# Patient Record
Sex: Male | Born: 1965 | Race: White | Hispanic: No | Marital: Single | Smoking: Former smoker
Health system: Southern US, Community
[De-identification: ages and names within clinical notes are randomized; demographics above are authoritative.]

## PROBLEM LIST (undated history)

## (undated) DIAGNOSIS — D329 Benign neoplasm of meninges, unspecified: Secondary | ICD-10-CM

## (undated) DIAGNOSIS — I10 Essential (primary) hypertension: Secondary | ICD-10-CM

## (undated) DIAGNOSIS — Z720 Tobacco use: Secondary | ICD-10-CM

## (undated) DIAGNOSIS — M1990 Unspecified osteoarthritis, unspecified site: Secondary | ICD-10-CM

## (undated) DIAGNOSIS — F1010 Alcohol abuse, uncomplicated: Secondary | ICD-10-CM

## (undated) DIAGNOSIS — R519 Headache, unspecified: Secondary | ICD-10-CM

## (undated) DIAGNOSIS — Z599 Problem related to housing and economic circumstances, unspecified: Secondary | ICD-10-CM

## (undated) DIAGNOSIS — Z598 Other problems related to housing and economic circumstances: Secondary | ICD-10-CM

## (undated) DIAGNOSIS — J449 Chronic obstructive pulmonary disease, unspecified: Secondary | ICD-10-CM

## (undated) DIAGNOSIS — I639 Cerebral infarction, unspecified: Secondary | ICD-10-CM

## (undated) HISTORY — DX: Cerebral infarction, unspecified: I63.9

## (undated) HISTORY — DX: Chronic obstructive pulmonary disease, unspecified: J44.9

## (undated) HISTORY — DX: Benign neoplasm of meninges, unspecified: D32.9

## (undated) HISTORY — PX: SHOULDER ARTHROSCOPY: SHX128

## (undated) HISTORY — DX: Headache, unspecified: R51.9

---

## 2002-02-04 ENCOUNTER — Emergency Department (HOSPITAL_COMMUNITY): Admission: EM | Admit: 2002-02-04 | Discharge: 2002-02-04 | Payer: Self-pay | Admitting: Emergency Medicine

## 2004-03-22 ENCOUNTER — Emergency Department (HOSPITAL_COMMUNITY): Admission: EM | Admit: 2004-03-22 | Discharge: 2004-03-22 | Payer: Self-pay | Admitting: Emergency Medicine

## 2004-07-27 ENCOUNTER — Emergency Department (HOSPITAL_COMMUNITY): Admission: EM | Admit: 2004-07-27 | Discharge: 2004-07-27 | Payer: Self-pay | Admitting: Emergency Medicine

## 2004-08-05 ENCOUNTER — Ambulatory Visit: Payer: Self-pay | Admitting: Family Medicine

## 2005-02-22 ENCOUNTER — Ambulatory Visit: Payer: Self-pay | Admitting: Family Medicine

## 2005-07-20 ENCOUNTER — Ambulatory Visit: Payer: Self-pay | Admitting: Family Medicine

## 2007-05-02 ENCOUNTER — Emergency Department (HOSPITAL_COMMUNITY): Admission: EM | Admit: 2007-05-02 | Discharge: 2007-05-02 | Payer: Self-pay | Admitting: Emergency Medicine

## 2008-05-05 ENCOUNTER — Emergency Department (HOSPITAL_COMMUNITY): Admission: EM | Admit: 2008-05-05 | Discharge: 2008-05-05 | Payer: Self-pay | Admitting: Emergency Medicine

## 2009-09-04 ENCOUNTER — Emergency Department (HOSPITAL_COMMUNITY): Admission: EM | Admit: 2009-09-04 | Discharge: 2009-09-04 | Payer: Self-pay | Admitting: Emergency Medicine

## 2011-04-26 LAB — DIFFERENTIAL
Basophils Absolute: 0.1
Basophils Relative: 1
Eosinophils Absolute: 0.1
Monocytes Absolute: 0.4
Monocytes Relative: 6
Neutrophils Relative %: 61

## 2011-04-26 LAB — SAMPLE TO BLOOD BANK

## 2011-04-26 LAB — I-STAT 8, (EC8 V) (CONVERTED LAB)
Acid-Base Excess: 1
Chloride: 103
Hemoglobin: 16.3
Potassium: 3.9

## 2011-04-26 LAB — POCT CARDIAC MARKERS
CKMB, poc: 1.2
Myoglobin, poc: 60.7
Operator id: 146091
Operator id: 285841
Troponin i, poc: <0.05

## 2011-04-26 LAB — POCT I-STAT CREATININE: Creatinine, Ser: 1

## 2011-04-26 LAB — CBC
HCT: 44.8
Platelets: 314
RBC: 4.87

## 2014-08-17 ENCOUNTER — Encounter (HOSPITAL_COMMUNITY): Payer: Self-pay | Admitting: Emergency Medicine

## 2014-08-17 ENCOUNTER — Emergency Department (HOSPITAL_COMMUNITY): Payer: Self-pay

## 2014-08-17 ENCOUNTER — Inpatient Hospital Stay (HOSPITAL_COMMUNITY)
Admission: EM | Admit: 2014-08-17 | Discharge: 2014-08-19 | DRG: 282 | Disposition: A | Discharge status: Home / Self Care | Payer: Self-pay | Attending: Interventional Cardiology | Admitting: Interventional Cardiology

## 2014-08-17 ENCOUNTER — Inpatient Hospital Stay (HOSPITAL_COMMUNITY): Payer: Self-pay

## 2014-08-17 DIAGNOSIS — F1010 Alcohol abuse, uncomplicated: Secondary | ICD-10-CM | POA: Diagnosis present

## 2014-08-17 DIAGNOSIS — I10 Essential (primary) hypertension: Secondary | ICD-10-CM | POA: Diagnosis present

## 2014-08-17 DIAGNOSIS — F17210 Nicotine dependence, cigarettes, uncomplicated: Secondary | ICD-10-CM | POA: Diagnosis present

## 2014-08-17 DIAGNOSIS — R0989 Other specified symptoms and signs involving the circulatory and respiratory systems: Secondary | ICD-10-CM

## 2014-08-17 DIAGNOSIS — Z599 Problem related to housing and economic circumstances, unspecified: Secondary | ICD-10-CM | POA: Insufficient documentation

## 2014-08-17 DIAGNOSIS — I214 Non-ST elevation (NSTEMI) myocardial infarction: Principal | ICD-10-CM | POA: Diagnosis present

## 2014-08-17 DIAGNOSIS — I200 Unstable angina: Secondary | ICD-10-CM | POA: Diagnosis present

## 2014-08-17 DIAGNOSIS — R001 Bradycardia, unspecified: Secondary | ICD-10-CM | POA: Diagnosis present

## 2014-08-17 DIAGNOSIS — I25111 Atherosclerotic heart disease of native coronary artery with angina pectoris with documented spasm: Secondary | ICD-10-CM | POA: Diagnosis present

## 2014-08-17 DIAGNOSIS — M1990 Unspecified osteoarthritis, unspecified site: Secondary | ICD-10-CM | POA: Insufficient documentation

## 2014-08-17 DIAGNOSIS — Z8249 Family history of ischemic heart disease and other diseases of the circulatory system: Secondary | ICD-10-CM

## 2014-08-17 DIAGNOSIS — R739 Hyperglycemia, unspecified: Secondary | ICD-10-CM | POA: Diagnosis present

## 2014-08-17 DIAGNOSIS — Z598 Other problems related to housing and economic circumstances: Secondary | ICD-10-CM | POA: Insufficient documentation

## 2014-08-17 DIAGNOSIS — Z720 Tobacco use: Secondary | ICD-10-CM

## 2014-08-17 DIAGNOSIS — R0789 Other chest pain: Secondary | ICD-10-CM

## 2014-08-17 HISTORY — DX: Unspecified osteoarthritis, unspecified site: M19.90

## 2014-08-17 HISTORY — DX: Problem related to housing and economic circumstances, unspecified: Z59.9

## 2014-08-17 HISTORY — DX: Other problems related to housing and economic circumstances: Z59.8

## 2014-08-17 HISTORY — DX: Alcohol abuse, uncomplicated: F10.10

## 2014-08-17 HISTORY — DX: Tobacco use: Z72.0

## 2014-08-17 HISTORY — DX: Essential (primary) hypertension: I10

## 2014-08-17 LAB — CBC
HCT: 49.2 % (ref 39.0–52.0)
HEMOGLOBIN: 17.2 g/dL — AB (ref 13.0–17.0)
MCH: 33.5 pg (ref 26.0–34.0)
MCHC: 35 g/dL (ref 30.0–36.0)
MCV: 95.9 fL (ref 78.0–100.0)
Platelets: 277 10*3/uL (ref 150–400)
RBC: 5.13 MIL/uL (ref 4.22–5.81)
RDW: 13.2 % (ref 11.5–15.5)
WBC: 9.7 10*3/uL (ref 4.0–10.5)

## 2014-08-17 LAB — TSH: TSH: 4.743 u[IU]/mL — ABNORMAL HIGH (ref 0.350–4.500)

## 2014-08-17 LAB — HEPATIC FUNCTION PANEL
ALT: 23 U/L (ref 0–53)
AST: 32 U/L (ref 0–37)
Albumin: 4 g/dL (ref 3.5–5.2)
Alkaline Phosphatase: 95 U/L (ref 39–117)
BILIRUBIN DIRECT: 0.2 mg/dL (ref 0.0–0.5)
BILIRUBIN TOTAL: 0.8 mg/dL (ref 0.3–1.2)
Indirect Bilirubin: 0.6 mg/dL (ref 0.3–0.9)
Total Protein: 6.9 g/dL (ref 6.0–8.3)

## 2014-08-17 LAB — BASIC METABOLIC PANEL
Anion gap: 10 (ref 5–15)
BUN: 11 mg/dL (ref 6–23)
CO2: 24 mmol/L (ref 19–32)
Calcium: 8.8 mg/dL (ref 8.4–10.5)
Chloride: 105 mmol/L (ref 96–112)
Creatinine, Ser: 1.09 mg/dL (ref 0.50–1.35)
GFR, EST AFRICAN AMERICAN: 90 mL/min — AB (ref >90)
GFR, EST NON AFRICAN AMERICAN: 77 mL/min — AB (ref >90)
Glucose, Bld: 177 mg/dL — ABNORMAL HIGH (ref 70–99)
POTASSIUM: 3.8 mmol/L (ref 3.5–5.1)
Sodium: 139 mmol/L (ref 135–145)

## 2014-08-17 LAB — PROTIME-INR
INR: 1.01 (ref 0.00–1.49)
PROTHROMBIN TIME: 13.4 s (ref 11.6–15.2)

## 2014-08-17 LAB — RAPID URINE DRUG SCREEN, HOSP PERFORMED
Amphetamines: NOT DETECTED
BARBITURATES: NOT DETECTED
BENZODIAZEPINES: NOT DETECTED
Cocaine: NOT DETECTED
OPIATES: NOT DETECTED
TETRAHYDROCANNABINOL: POSITIVE — AB

## 2014-08-17 LAB — MRSA PCR SCREENING: MRSA by PCR: NEGATIVE

## 2014-08-17 LAB — TROPONIN I
Troponin I: 0.09 ng/mL — ABNORMAL HIGH (ref <0.031)
Troponin I: 0.44 ng/mL — ABNORMAL HIGH (ref <0.031)

## 2014-08-17 LAB — I-STAT TROPONIN, ED: Troponin i, poc: 0 ng/mL (ref 0.00–0.08)

## 2014-08-17 LAB — MAGNESIUM: Magnesium: 2.1 mg/dL (ref 1.5–2.5)

## 2014-08-17 MED ORDER — LORAZEPAM 2 MG/ML IJ SOLN: 1.0000 mg | Freq: Four times a day (QID) | INTRAMUSCULAR | Status: DC | PRN

## 2014-08-17 MED ORDER — FOLIC ACID 1 MG PO TABS
1.0000 mg | ORAL_TABLET | Freq: Every day | ORAL | Status: DC
  Administered 2014-08-17 – 2014-08-19 (×3): 1 mg via ORAL
  Filled 2014-08-17 (×3): qty 1

## 2014-08-17 MED ORDER — SODIUM CHLORIDE 0.9 % IV SOLN
1.0000 mL/kg/h | INTRAVENOUS | Status: DC
  Administered 2014-08-18: 1 mL/kg/h via INTRAVENOUS

## 2014-08-17 MED ORDER — ATORVASTATIN CALCIUM 20 MG PO TABS
20.0000 mg | ORAL_TABLET | Freq: Every day | ORAL | Status: DC
  Administered 2014-08-18: 20 mg via ORAL
  Filled 2014-08-17 (×3): qty 1

## 2014-08-17 MED ORDER — VITAMIN B-1 100 MG PO TABS
100.0000 mg | ORAL_TABLET | Freq: Every day | ORAL | Status: DC
  Administered 2014-08-17 – 2014-08-19 (×3): 100 mg via ORAL
  Filled 2014-08-17 (×3): qty 1

## 2014-08-17 MED ORDER — ONDANSETRON HCL 4 MG/2ML IJ SOLN: 4.0000 mg | Freq: Four times a day (QID) | INTRAMUSCULAR | Status: DC | PRN

## 2014-08-17 MED ORDER — NITROGLYCERIN 0.4 MG SL SUBL
0.4000 mg | SUBLINGUAL_TABLET | SUBLINGUAL | Status: AC | PRN
Start: 2014-08-17 — End: 2014-08-17
  Administered 2014-08-17 (×3): 0.4 mg via SUBLINGUAL
  Filled 2014-08-17: qty 1

## 2014-08-17 MED ORDER — SODIUM CHLORIDE 0.9 % IJ SOLN
3.0000 mL | INTRAMUSCULAR | Status: DC | PRN
Start: 2014-08-17 — End: 2014-08-19

## 2014-08-17 MED ORDER — LORAZEPAM 1 MG PO TABS: 1.0000 mg | ORAL_TABLET | Freq: Four times a day (QID) | ORAL | Status: DC | PRN

## 2014-08-17 MED ORDER — ASPIRIN 325 MG PO TABS
325.0000 mg | ORAL_TABLET | Freq: Once | ORAL | Status: AC
Start: 2014-08-17 — End: 2014-08-17
  Administered 2014-08-17: 325 mg via ORAL
  Filled 2014-08-17: qty 1

## 2014-08-17 MED ORDER — ASPIRIN 81 MG PO CHEW
81.0000 mg | CHEWABLE_TABLET | ORAL | Status: AC
  Administered 2014-08-18: 81 mg via ORAL
  Filled 2014-08-17: qty 1

## 2014-08-17 MED ORDER — LORAZEPAM 1 MG PO TABS
0.0000 mg | ORAL_TABLET | Freq: Two times a day (BID) | ORAL | Status: DC
Start: 2014-08-19 — End: 2014-08-19

## 2014-08-17 MED ORDER — NITROGLYCERIN IN D5W 200-5 MCG/ML-% IV SOLN
2.0000 ug/min | INTRAVENOUS | Status: DC
  Administered 2014-08-17: 5 ug/min via INTRAVENOUS
  Filled 2014-08-17: qty 250

## 2014-08-17 MED ORDER — HEPARIN BOLUS VIA INFUSION
4000.0000 [IU] | Freq: Once | INTRAVENOUS | Status: AC
  Administered 2014-08-17: 4000 [IU] via INTRAVENOUS
  Filled 2014-08-17: qty 4000

## 2014-08-17 MED ORDER — THIAMINE HCL 100 MG/ML IJ SOLN
100.0000 mg | Freq: Every day | INTRAMUSCULAR | Status: DC
  Filled 2014-08-17 (×3): qty 1

## 2014-08-17 MED ORDER — SODIUM CHLORIDE 0.9 % IV SOLN: 250.0000 mL | INTRAVENOUS | Status: DC | PRN

## 2014-08-17 MED ORDER — ASPIRIN EC 81 MG PO TBEC
81.0000 mg | DELAYED_RELEASE_TABLET | Freq: Every day | ORAL | Status: DC
  Administered 2014-08-19: 81 mg via ORAL
  Filled 2014-08-17 (×2): qty 1

## 2014-08-17 MED ORDER — LORAZEPAM 1 MG PO TABS
0.0000 mg | ORAL_TABLET | Freq: Four times a day (QID) | ORAL | Status: DC
  Administered 2014-08-18: 1 mg via ORAL
  Filled 2014-08-17: qty 1

## 2014-08-17 MED ORDER — NITROGLYCERIN 0.4 MG SL SUBL
0.4000 mg | SUBLINGUAL_TABLET | SUBLINGUAL | Status: DC | PRN
Start: 2014-08-17 — End: 2014-08-19

## 2014-08-17 MED ORDER — ADULT MULTIVITAMIN W/MINERALS CH
1.0000 | ORAL_TABLET | Freq: Every day | ORAL | Status: DC
  Administered 2014-08-17 – 2014-08-19 (×3): 1 via ORAL
  Filled 2014-08-17 (×3): qty 1

## 2014-08-17 MED ORDER — HEPARIN (PORCINE) IN NACL 100-0.45 UNIT/ML-% IJ SOLN
1450.0000 [IU]/h | INTRAMUSCULAR | Status: DC
  Administered 2014-08-17: 1150 [IU]/h via INTRAVENOUS
  Filled 2014-08-17: qty 250

## 2014-08-17 MED ORDER — ACETAMINOPHEN 325 MG PO TABS: 650.0000 mg | ORAL_TABLET | ORAL | Status: DC | PRN

## 2014-08-17 MED ORDER — SODIUM CHLORIDE 0.9 % IJ SOLN
3.0000 mL | Freq: Two times a day (BID) | INTRAMUSCULAR | Status: DC
  Administered 2014-08-18 (×2): 3 mL via INTRAVENOUS

## 2014-08-17 NOTE — ED Provider Notes (Signed)
Patient signed out to me by Mayo Ao, PA-C with plan to follow-up on delta troponin.  Mr. Flanigan is a 49 year old male smoker with past medical history of hypertension who presents the ER after sudden onset of central chest pressure while at work today. Patient was worked up for ACS in the ER, had relief of his pain after nitroglycerin 3 and aspirin 325 mg. Due to the time of onset with patient's pain, delta troponin was placed and was noted to be positive at a level of 0.09. Based on the reference value, this is a positive results, and in comparison to the i-STAT troponin which is a 0.00, we will consult cardiology, and plan to admit patient for ACS rule out.  PE: Constitutional: well-developed, well-nourished, no apparent distress HENT: normocephalic, atraumatic Cardiovascular: normal rate and rhythm, distal pulses intact Pulmonary/Chest: effort normal; breath sounds clear and equal bilaterally; no wheezes or rales Abdominal: soft and nontender Musculoskeletal: full ROM, no edema Lymphadenopathy: no cervical adenopathy Neurological: alert with goal directed thinking Skin: warm and dry, no rash, no diaphoresis Psychiatric: normal mood and affect, normal behavior    EKG Interpretation  Date/Time:  Monday August 17 2014 10:54:48 EST Ventricular Rate:  55 PR Interval:  133 QRS Duration: 91 QT Interval:  430 QTC Calculation: 411 R Axis:   82 Text Interpretation:  Sinus rhythm Anteroseptal infarct, old Since last tracing rate slower and resolution of incomplete RBBB Confirmed by Eulis Foster  MD, ELLIOTT ([id number]) on 08/17/2014 11:25:36 AM       Cardiology has seen and evaluated patient, and agreed that patient should be admitted for ACS rule out. Patient admitted to cardiology service.The patient appears reasonably stabilized for admission considering the current resources, flow, and capabilities available in the ED at this time, and I doubt any other [facility] requiring further screening and/or  treatment in the ED prior to admission.  BP 148/72 mmHg  Pulse 48  Temp(Src) 98.3 F (36.8 C) (Oral)  Resp 17  Ht 6\' 2"  (1.88 m)  Wt 192 lb (87.091 kg)  BMI 24.64 kg/m2  SpO2 98%  Signed,  Dahlia Bailiff, PA-C 7:35 PM   Carrie Mew, PA-C 08/17/14 [place], MD 08/17/14 2046

## 2014-08-17 NOTE — Progress Notes (Signed)
ANTICOAGULATION CONSULT NOTE - Initial Consult  Pharmacy Consult for Heparin  Indication: chest pain/ACS  No Known Allergies  Patient Measurements: Height: 6\' 2"  (188 cm) Weight: 192 lb (87.091 kg) IBW/kg (Calculated) : 82.2 Heparin Dosing Weight: 87 kg   Vital Signs: Temp: 98.3 F (36.8 C) (02/01 1059) Temp Source: Oral (02/01 1059) BP: 150/80 mmHg (02/01 1745) Pulse Rate: 57 (02/01 1745)  Labs:  Recent Labs  08/17/14 1111 08/17/14 1432  HGB 17.2*  --   HCT 49.2  --   PLT 277  --   CREATININE 1.09  --   TROPONINI  --  0.09*    Estimated Creatinine Clearance: 94.3 mL/min (by C-G formula based on Cr of 1.09).   Medical History: Past Medical History  Diagnosis Date  . Arthritis   . Hypertension   . Financial difficulty   . Tobacco abuse   . ETOH abuse     Medications:   (Not in a hospital admission)  Assessment: 72 YOM with no prior cardiac history presents to [facility] with chest pain. Labs were initially unremarkable but troponin now up slightly to 0.09. Pharmacy consulted to start IV heparin for ACS/NSTEMI. Hgb elevated, Plt wnl. Not on anticoagulation prior to admission   Goal of Therapy:  Heparin level 0.3-0.7 units/ml Monitor platelets by anticoagulation protocol: Yes   Plan:  -Given heparin 4000 units bolus followed by heparin infusion at 1150 units/hr -F/u 6 hr HL -Monitor daily HL, CBC and s/s of bleeding   Albertina Parr, PharmD., BCPS Clinical Pharmacist Pager [phone]

## 2014-08-17 NOTE — Progress Notes (Signed)
Pt requested to go outside and smoke. Told him this was not allowed. He asked Dr. Irish Lack the same thing. We offered him nicotine replacement (patch, gum) but he declined. Budd Freiermuth PA-C

## 2014-08-17 NOTE — H&P (Signed)
History and Physical  Patient ID: Kevin Ritter MRN: [id number], DOB: [birth year] Date of Encounter: 08/17/2014, 5:15 PM Primary Physician: No primary care provider on file. Primary Cardiologist: New ->  Chief Complaint: chest pain Reason for Admission: chest pain, bradycardia, elevated troponin  HPI: Kevin Ritter is a 49 y/o M with no prior cardiac history but a history of habitual tobacco/EtOH abuse (6 pack per day), occasional marijuana use, HTN (out of meds for 2 weeks) who presented to [facility] today with chest pain. He recently separated from his wife and reports financial constraints thus stopped taking his blood pressure medications. He continues to smoke and drink alcohol. 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 He notified his boss and subsequently left to come to the ER. Here he received 3 SL NTG with relief of pain down to a 0-1/10 and then 325mg  ASA. CXR nonacute. Labs remarkable for troponin 0.00 -> then mildly positive at 0.09. Hgb 17.2, glucose 177. BP elevated in the 629U-765Y systolic. He does not exercise but states his job is fairly exertional - denies recent chest pain or DOE. He denies any SOB, bleeding, LEE, lightheadedness or syncope.  Tele in ER reveals sinus bradycardia mostly HR 40s-50s, one dip into the upper 30s, brief instance of increased ectopy with couplets and triplet, and one episode of brief irregularity (still sinus) with a P wave then dropped QRS - varying P wave morphologies circumferential to this event, not clearly a Wenckebach event. He was not symptomatic with this event.   Past Medical History  Diagnosis Date  . Arthritis   . Hypertension   . Financial difficulty   . Tobacco abuse   . ETOH abuse      Most Recent Cardiac Studies: None   Surgical History:  Past Surgical  History  Procedure Laterality Date  . Shoulder arthroscopy      left     Home Meds: Prior to Admission medications   Medication Sig Start Date End Date Taking? Authorizing Provider  ibuprofen (ADVIL,MOTRIN) 200 MG tablet Take 200 mg by mouth every 6 (six) hours as needed for headache.   Yes Historical Provider, MD  losartan (COZAAR) 25 MG tablet Take 25 mg by mouth daily.   Yes Historical Provider, MD    Allergies: No Known Allergies  History   Social History  . Marital Status: Married    Spouse Name: N/A    Number of Children: N/A  . Years of Education: N/A   Occupational History  .      Construction   Social History Main Topics  . Smoking status: Current Every Day Smoker  . Smokeless tobacco: Current User     Comment: Since age 68-9.   Marland Kitchen Alcohol Use: 0.0 oz/week    0 Not specified per week     Comment: 6 pack of beer every night  . Drug Use: Yes    Special: Marijuana     Comment: Last use on Sunday 08/16/14  . Sexual Activity: Not on file   Other Topics Concern  . Not on file   Social History Narrative     Family History  Problem Relation Age of Onset  . CAD Neg Hx   . Hypertension      Review of Systems: General: negative for chills, fever, night sweats or weight  changes.  Cardiovascular: no orthopnea, PND Dermatological: negative for rash Respiratory: negative for cough or wheezing Urologic: negative for hematuria Abdominal: negative for nausea, vomiting, diarrhea, bright red blood per rectum, melena, or hematemesis Neurologic: negative for visual changes, syncope, or dizziness All other systems reviewed and are otherwise negative except as noted above.  Labs:   Lab Results  Component Value Date   WBC 9.7 08/17/2014   HGB 17.2* 08/17/2014   HCT 49.2 08/17/2014   MCV 95.9 08/17/2014   PLT 277 08/17/2014    Recent Labs Lab 08/17/14 1111  NA 139  K 3.8  CL 105  CO2 24  BUN 11  CREATININE 1.09  CALCIUM 8.8  GLUCOSE 177*    Recent  Labs  08/17/14 1432  TROPONINI 0.09*   No results found for: CHOL, HDL, LDLCALC, TRIG Lab Results  Component Value Date   DDIMER  05/02/2007    <0.22        AT THE INHOUSE ESTABLISHED CUTOFF VALUE OF 0.48 ug/mL FEU, THIS ASSAY HAS BEEN DOCUMENTED IN THE LITERATURE TO HAVE    Radiology/Studies:  Dg Chest 2 View  08/17/2014   CLINICAL DATA:  Chest pain beginning at 10:30 this morning.  EXAM: CHEST  2 VIEW  COMPARISON:  PA and lateral chest 11/03/2008. Single view of the chest 05/02/2007.  FINDINGS: The lungs are clear. Heart size is normal. There is no pneumothorax pleural effusion. Rounded sclerotic lesion in the right glenoid is unchanged and most consistent with a bone island.  IMPRESSION: No acute disease.   Electronically Signed   By: Inge Rise M.D.   On: 08/17/2014 12:41   Wt Readings from Last 3 Encounters:  08/17/14 192 lb (87.091 kg)    EKG:  10:54 - NSR 55bpm old anteroseptal infarct, hyperacute T waes in V3-V4, slight ST upsloping in II, III, avF, TWI avL 17:07 - Repeat tracing generally unchanged  Physical Exam: Blood pressure 176/77, pulse 50, temperature 98.3 F (36.8 C), temperature source Oral, resp. rate 18, height 6\' 2"  (1.88 m), weight 192 lb (87.091 kg), SpO2 98 %. General: Well developed, well nourished WM, in no acute distress. Long facial hair. Multiple tattoos including homemade appearing ones. Head: Normocephalic, atraumatic, sclera non-icteric, no xanthomas, nares are without discharge.  Neck: Negative for carotid bruits. JVD not elevated. Lungs: Clear bilaterally to auscultation without wheezes, rales, or rhonchi. Breathing is unlabored. Heart: RRR with S1 S2. No murmurs, rubs, or gallops appreciated. Abdomen: Soft, non-tender, non-distended with normoactive bowel sounds. No hepatomegaly. No rebound/guarding. Abdominal pulsations are prominent. Msk:  Strength and tone appear normal for age. Extremities: No clubbing or cyanosis. No edema.  Distal  pedal pulses are 2+ and equal bilaterally. Neuro: Alert and oriented X 3. No focal deficit. No facial asymmetry. Moves all extremities spontaneously. Psych:  Responds to questions appropriately with a normal affect.    ASSESSMENT AND PLAN:   1. Chest pain concerning for ACS ([place] vs NSTEMI) 2. Rhythm: Sinus bradycardia, PVCs, one episode of dropped QRS in ED (in the setting of ?MAT)  3. Hypertension 4. Hyperglycemia - glucose 177  5. Ongoing tobacco abuse 6. Ongoing alcohol abuse (6 pack per day) 7. Financial constraints and lack of insurance 8. Palpable abdominal pulsations  Symptoms and troponin are concerning for anginal event. Will admit, cycle enzymes, and start IV heparin. Check UDS. Check A1C, TSH, free T4, magnesium level. Initiate CIWA protocol - tobacco/THC/EtOH cessation advised. Will also order abdominal aortic duplex given prominent aortic pulsations -  may be due to thin torso/HTN but he has long tobacco history and this would be difficult to pursue as outpatient given lack of insurance. Continue ASA. Check baseline LFTs and add statin. No BB given bradycardia. Add IV NTG for blood pressure. If he has worsening pain or EKG changes would pursue cardiac cath sooner. Care management consult for med resources/no insurance.  Signed, Melina Copa PA-C 08/17/2014, 5:15 PM  I have examined the patient and reviewed assessment and plan and discussed with patient.  Agree with above as stated.  Unstable angina with high risk features.  Some transient hyperacute T waves anteriorly.  IV Heparin, IV NTG for BP control.  Cannot let the patient leave to smoke.  Plan for cath tomorrow.  He is agreeable.  Tryphena Perkovich S.

## 2014-08-17 NOTE — ED Notes (Signed)
Pt undressed, on monitor and EKG performed

## 2014-08-17 NOTE — ED Provider Notes (Signed)
CSN: [id number]     Arrival date & time 08/17/14  1048 History   First MD Initiated Contact with Patient 08/17/14 1052     Chief Complaint  Patient presents with  . Chest Pain     (Consider location/radiation/quality/duration/timing/severity/associated sxs/prior Treatment) HPI Comments: 49 year old male with past medical history of hypertension and arthritis presenting to the ED complaining of sudden onset left-sided chest pain beginning around 10:30 AM today, less than one hour prior to arrival. Pain is constant, nonradiating rated 8/10. No aggravating or alleviating factors. Admits to associated nausea without vomiting. Denies dizziness, lightheadedness, headache, shortness of breath or diaphoresis. States he felt hot. Denies ever having chest pain like this in the past. No medications taken prior to arrival. He is occurring daily smoker. No family history of early heart disease.  Patient is a 49 y.o. male presenting with chest pain. The history is provided by the patient.  Chest Pain   Past Medical History  Diagnosis Date  . Arthritis   . Hypertension    Past Surgical History  Procedure Laterality Date  . Shoulder arthroscopy      left   No family history on file. History  Substance Use Topics  . Smoking status: Current Every Day Smoker  . Smokeless tobacco: Current User  . Alcohol Use: Yes     Comment: 40 oz every night    Review of Systems  Cardiovascular: Positive for chest pain.  All other systems reviewed and are negative.     Allergies  Review of patient's allergies indicates no known allergies.  Home Medications   Prior to Admission medications   Medication Sig Start Date End Date Taking? Authorizing Provider  ibuprofen (ADVIL,MOTRIN) 200 MG tablet Take 200 mg by mouth every 6 (six) hours as needed for headache.   Yes Historical Provider, MD  losartan (COZAAR) 25 MG tablet Take 25 mg by mouth daily.   Yes Historical Provider, MD   BP 157/78 mmHg  Pulse 44   Temp(Src) 98.3 F (36.8 C) (Oral)  Resp 18  Ht 6\' 2"  (1.88 m)  Wt 192 lb (87.091 kg)  BMI 24.64 kg/m2  SpO2 97% Physical Exam  Constitutional: He is oriented to person, place, and time. He appears well-developed and well-nourished. No distress.  HENT:  Head: Normocephalic and atraumatic.  Mouth/Throat: Oropharynx is clear and moist.  Eyes: Conjunctivae and EOM are normal. Pupils are equal, round, and reactive to light.  Neck: Normal range of motion. Neck supple. No JVD present.  Cardiovascular: Normal rate, regular rhythm, normal heart sounds and intact distal pulses.   No extremity edema.  Pulmonary/Chest: Effort normal and breath sounds normal. No respiratory distress.  Abdominal: Soft. Bowel sounds are normal. There is no tenderness.  Musculoskeletal: Normal range of motion. He exhibits no edema.  Neurological: He is alert and oriented to person, place, and time. He has normal strength. No sensory deficit.  Speech fluent, goal oriented. Moves limbs without ataxia. Equal grip strength bilateral.  Skin: Skin is warm and dry. He is not diaphoretic.  Psychiatric: He has a normal mood and affect. His behavior is normal.  Nursing note and vitals reviewed.   ED Course  Procedures (including critical care time) Labs Review Labs Reviewed  CBC - Abnormal; Notable for the following:    Hemoglobin 17.2 (*)    All other components within normal limits  BASIC METABOLIC PANEL - Abnormal; Notable for the following:    Glucose, Bld 177 (*)    GFR calc  non Af Amer 77 (*)    GFR calc Af Amer 90 (*)    All other components within normal limits  TROPONIN I  I-STAT TROPOININ, ED    Imaging Review Dg Chest 2 View  08/17/2014   CLINICAL DATA:  Chest pain beginning at 10:30 this morning.  EXAM: CHEST  2 VIEW  COMPARISON:  PA and lateral chest 11/03/2008. Single view of the chest 05/02/2007.  FINDINGS: The lungs are clear. Heart size is normal. There is no pneumothorax pleural effusion.  Rounded sclerotic lesion in the right glenoid is unchanged and most consistent with a bone island.  IMPRESSION: No acute disease.   Electronically Signed   By: Inge Rise M.D.   On: 08/17/2014 12:41     EKG Interpretation   Date/Time:  Monday August 17 2014 10:54:48 EST Ventricular Rate:  55 PR Interval:  133 QRS Duration: 91 QT Interval:  430 QTC Calculation: 411 R Axis:   82 Text Interpretation:  Sinus rhythm Anteroseptal infarct, old Since last  tracing rate slower and resolution of incomplete RBBB Confirmed by WENTZ   MD, ELLIOTT ([id number]) on 08/17/2014 11:25:36 AM      MDM   Final diagnoses:  Midsternal chest pain   Pt presenting with chest pain while at work. NAD. AFVSS. Pain 8/10 on arrival, 2/10 after 3 SL nitro. HEART score 3. Doubt PE, other than age would PERC negative. No cardiac hx. Plan to check delta troponin. If negative, d/c home with outpatient cardiology f/u. Pt signed out to Dahlia Bailiff, PA-C at shift change. Troponin pending.  Discussed with attending Dr. Eulis Foster who agrees with plan of care.   Carman Ching, PA-C 08/17/14 [place], MD 08/17/14 [phone]

## 2014-08-17 NOTE — ED Notes (Signed)
Patient coming from work with sudden onset of left sided chest pain.  C/o of lightheaded, dizzy, and back pains.

## 2014-08-17 NOTE — Discharge Instructions (Signed)
Take aspirin 81 mg daily. Follow up with cardiology.  Chest Pain (Nonspecific) It is often hard to give a specific diagnosis for the cause of chest pain. There is always a chance that your pain could be related to something serious, such as a heart attack or a blood clot in the lungs. You need to follow up with your health care provider for further evaluation. CAUSES   Heartburn.  Pneumonia or bronchitis.  Anxiety or stress.  Inflammation around your heart (pericarditis) or lung (pleuritis or pleurisy).  A blood clot in the lung.  A collapsed lung (pneumothorax). It can develop suddenly on its own (spontaneous pneumothorax) or from trauma to the chest.  Shingles infection (herpes zoster virus). The chest wall is composed of bones, muscles, and cartilage. Any of these can be the source of the pain.  The bones can be bruised by injury.  The muscles or cartilage can be strained by coughing or overwork.  The cartilage can be affected by inflammation and become sore (costochondritis). DIAGNOSIS  Lab tests or other studies may be needed to find the cause of your pain. Your health care provider may have you take a test called an ambulatory electrocardiogram (ECG). An ECG records your heartbeat patterns over a 24-hour period. You may also have other tests, such as:  Transthoracic echocardiogram (TTE). During echocardiography, sound waves are used to evaluate how blood flows through your heart.  Transesophageal echocardiogram (TEE).  Cardiac monitoring. This allows your health care provider to monitor your heart rate and rhythm in real time.  Holter monitor. This is a portable device that records your heartbeat and can help diagnose heart arrhythmias. It allows your health care provider to track your heart activity for several days, if needed.  Stress tests by exercise or by giving medicine that makes the heart beat faster. TREATMENT   Treatment depends on what may be causing your  chest pain. Treatment may include:  Acid blockers for heartburn.  Anti-inflammatory medicine.  Pain medicine for inflammatory conditions.  Antibiotics if an infection is present.  You may be advised to change lifestyle habits. This includes stopping smoking and avoiding alcohol, caffeine, and chocolate.  You may be advised to keep your head raised (elevated) when sleeping. This reduces the chance of acid going backward from your stomach into your esophagus. Most of the time, nonspecific chest pain will improve within 2-3 days with rest and mild pain medicine.  HOME CARE INSTRUCTIONS   If antibiotics were prescribed, take them as directed. Finish them even if you start to feel better.  For the next few days, avoid physical activities that bring on chest pain. Continue physical activities as directed.  Do not use any tobacco products, including cigarettes, chewing tobacco, or electronic cigarettes.  Avoid drinking alcohol.  Only take medicine as directed by your health care provider.  Follow your health care provider's suggestions for further testing if your chest pain does not go away.  Keep any follow-up appointments you made. If you do not go to an appointment, you could develop lasting (chronic) problems with pain. If there is any problem keeping an appointment, call to reschedule. SEEK MEDICAL CARE IF:   Your chest pain does not go away, even after treatment.  You have a rash with blisters on your chest.  You have a fever. SEEK IMMEDIATE MEDICAL CARE IF:   You have increased chest pain or pain that spreads to your arm, neck, jaw, back, or abdomen.  You have shortness of  breath.  You have an increasing cough, or you cough up blood.  You have severe back or abdominal pain.  You feel nauseous or vomit.  You have severe weakness.  You faint.  You have chills. This is an emergency. Do not wait to see if the pain will go away. Get medical help at once. Call your  local emergency services (911 in [place] Do not drive yourself to the hospital. MAKE SURE YOU:   Understand these instructions.  Will watch your condition.  Will get help right away if you are not doing well or get worse. Document Released: 04/12/2005 Document Revised: 07/08/2013 Document Reviewed: 02/06/2008 [facility] Patient Information 2015 [place], [place]. This information is not intended to replace advice given to you by your health care provider. Make sure you discuss any questions you have with your health care provider. RESOURCE GUIDE  Chronic Pain Problems: Contact [facility]  [phone] Patients need to be referred by their primary care doctor.  Insufficient Money for Medicine: Contact [employer]:  call "211."   No Primary Care Doctor: - Call [facility] Connect  [phone] - can help you locate a primary care doctor that  accepts your insurance, provides certain services, etc. - Physician Referral Service- [phone]  Agencies that provide inexpensive medical care: - [facility]  [phone] - [facility]  [phone] - [facility]  [phone] - [facility]  [phone] - [facility]  [phone] - [facility]  [phone]  [place] Providers: - [facility]- [address], [address]  [phone], Mon-Fri 9am-7pm, Sat 9am-1pm - [place], [place], [address] [place]  [phone] [facility]- [address]  [place], [address], [phone]  Only accepts [place] patients after they have their name  applied to their card  Self Pay (no insurance) in [place]: - Sickle Cell Patients: Dr Kevan Ny, [facility]  [place], [facility]-  [place]  [facility]- [address], [facility]- see information above (Speak to [employer] if you do not have insurance)       -  [facility]- [place],  [place], [place]  Dr Vista Lawman-  [address], [place], [place], [place]       -  [facility] - [address], [phone]       -  [facility]- [address], [place], also [address] [phone]       -    [facility]- [address], [place], 1st & 3rd Saturday        every month, 10am-1pm  1) Find a Doctor and Pay Out of Pocket Although you won't have to find out who is covered by your insurance plan, it is a good idea to ask around and get recommendations. You will then need to call the office  and see if the doctor you have chosen will accept you as a new patient and what types of options they offer for patients who are self-pay. Some doctors offer discounts or will set up payment plans for their patients who do not have insurance, but you will need to ask so you aren't surprised when you get to your appointment.  2) Contact Your Local Health Department Not all health departments have doctors that can see patients for sick visits, but many do, so it is worth a call to see if yours does. If you don't know where your local health department is, you can check in your phone book. The CDC also has a tool to help you locate your state's health department, and many state websites also have listings of all of their local health departments.  3) Find a [facility] If your illness is not likely to be very severe or complicated, you may want to try a walk in clinic. These are popping up all over the country in pharmacies, drugstores, and shopping centers. They're usually staffed by nurse practitioners or  physician assistants that have been trained to treat common illnesses and complaints. They're usually fairly quick and inexpensive. However, if you have serious medical issues or chronic medical problems, these are probably not your best option

## 2014-08-18 ENCOUNTER — Encounter (HOSPITAL_COMMUNITY)
Admission: EM | Disposition: A | Discharge status: Home / Self Care | Payer: Self-pay | Source: Home / Self Care | Attending: Interventional Cardiology

## 2014-08-18 DIAGNOSIS — I2510 Atherosclerotic heart disease of native coronary artery without angina pectoris: Secondary | ICD-10-CM

## 2014-08-18 DIAGNOSIS — I214 Non-ST elevation (NSTEMI) myocardial infarction: Secondary | ICD-10-CM | POA: Diagnosis present

## 2014-08-18 HISTORY — PX: LEFT HEART CATHETERIZATION WITH CORONARY ANGIOGRAM: SHX5451

## 2014-08-18 LAB — BASIC METABOLIC PANEL
Anion gap: 9 (ref 5–15)
BUN: 13 mg/dL (ref 6–23)
CO2: 24 mmol/L (ref 19–32)
Calcium: 9 mg/dL (ref 8.4–10.5)
Chloride: 103 mmol/L (ref 96–112)
Creatinine, Ser: 1.24 mg/dL (ref 0.50–1.35)
GFR calc non Af Amer: 66 mL/min — ABNORMAL LOW (ref >90)
GFR, EST AFRICAN AMERICAN: 77 mL/min — AB (ref >90)
Glucose, Bld: 110 mg/dL — ABNORMAL HIGH (ref 70–99)
POTASSIUM: 3.6 mmol/L (ref 3.5–5.1)
Sodium: 136 mmol/L (ref 135–145)

## 2014-08-18 LAB — CBC
HEMATOCRIT: 44.4 % (ref 39.0–52.0)
Hemoglobin: 15.3 g/dL (ref 13.0–17.0)
MCH: 33.4 pg (ref 26.0–34.0)
MCHC: 34.5 g/dL (ref 30.0–36.0)
MCV: 96.9 fL (ref 78.0–100.0)
Platelets: 246 10*3/uL (ref 150–400)
RBC: 4.58 MIL/uL (ref 4.22–5.81)
RDW: 13.3 % (ref 11.5–15.5)
WBC: 8.9 10*3/uL (ref 4.0–10.5)

## 2014-08-18 LAB — LIPID PANEL
CHOLESTEROL: 194 mg/dL (ref 0–200)
HDL: 64 mg/dL (ref >39)
LDL Cholesterol: 108 mg/dL — ABNORMAL HIGH (ref 0–99)
Total CHOL/HDL Ratio: 3 RATIO
Triglycerides: 110 mg/dL (ref <150)
VLDL: 22 mg/dL (ref 0–40)

## 2014-08-18 LAB — HEPARIN LEVEL (UNFRACTIONATED)
HEPARIN UNFRACTIONATED: 0.23 [IU]/mL — AB (ref 0.30–0.70)
Heparin Unfractionated: 0.29 IU/mL — ABNORMAL LOW (ref 0.30–0.70)

## 2014-08-18 LAB — TROPONIN I
TROPONIN I: 0.42 ng/mL — AB (ref <0.031)
Troponin I: 0.61 ng/mL (ref <0.031)

## 2014-08-18 LAB — T4, FREE: Free T4: 0.98 ng/dL (ref 0.80–1.80)

## 2014-08-18 SURGERY — LEFT HEART CATHETERIZATION WITH CORONARY ANGIOGRAM

## 2014-08-18 MED ORDER — SODIUM CHLORIDE 0.9 % IV SOLN
INTRAVENOUS | Status: AC
  Administered 2014-08-18: 13:00:00 via INTRAVENOUS

## 2014-08-18 MED ORDER — HEPARIN SODIUM (PORCINE) 1000 UNIT/ML IJ SOLN
INTRAMUSCULAR | Status: AC
  Filled 2014-08-18: qty 1

## 2014-08-18 MED ORDER — LIDOCAINE HCL (PF) 1 % IJ SOLN
INTRAMUSCULAR | Status: AC
  Filled 2014-08-18: qty 30

## 2014-08-18 MED ORDER — METOPROLOL TARTRATE 25 MG PO TABS
25.0000 mg | ORAL_TABLET | Freq: Two times a day (BID) | ORAL | Status: DC
  Administered 2014-08-18 – 2014-08-19 (×3): 25 mg via ORAL
  Filled 2014-08-18 (×4): qty 1

## 2014-08-18 MED ORDER — CLOPIDOGREL BISULFATE 300 MG PO TABS
300.0000 mg | ORAL_TABLET | Freq: Once | ORAL | Status: AC
  Administered 2014-08-18: 300 mg via ORAL
  Filled 2014-08-18: qty 1

## 2014-08-18 MED ORDER — HEPARIN (PORCINE) IN NACL 2-0.9 UNIT/ML-% IJ SOLN
INTRAMUSCULAR | Status: AC
  Filled 2014-08-18: qty 1000

## 2014-08-18 MED ORDER — MIDAZOLAM HCL 2 MG/2ML IJ SOLN
INTRAMUSCULAR | Status: AC
  Filled 2014-08-18: qty 2

## 2014-08-18 MED ORDER — CLOPIDOGREL BISULFATE 75 MG PO TABS
75.0000 mg | ORAL_TABLET | Freq: Every day | ORAL | Status: DC
  Administered 2014-08-19: 75 mg via ORAL
  Filled 2014-08-18: qty 1

## 2014-08-18 MED ORDER — VERAPAMIL HCL 2.5 MG/ML IV SOLN
INTRAVENOUS | Status: AC
  Filled 2014-08-18: qty 2

## 2014-08-18 MED ORDER — FENTANYL CITRATE 0.05 MG/ML IJ SOLN
INTRAMUSCULAR | Status: AC
  Filled 2014-08-18: qty 2

## 2014-08-18 MED ORDER — NITROGLYCERIN 1 MG/10 ML FOR IR/CATH LAB
INTRA_ARTERIAL | Status: AC
  Filled 2014-08-18: qty 10

## 2014-08-18 NOTE — Care Management Note (Signed)
    Page 1 of 1   08/18/2014     10:08:54 AM CARE MANAGEMENT NOTE 08/18/2014  Patient:  Malachi,Male   Account Number:  [id number]  Date Initiated:  08/18/2014  Documentation initiated by:  Elissa Hefty  Subjective/Objective Assessment:   adm w angina     Action/Plan:   lives at home pta   Anticipated DC Date:     Anticipated DC Plan:  [place]  CM consult  [facility]      Choice offered to / List presented to:             Status of service:   Medicare Important Message given?   (If response is "NO", the following Medicare IM given date fields will be blank) Date Medicare IM given:   Medicare IM given by:   Date Additional Medicare IM given:   Additional Medicare IM given by:    Discharge Disposition:    Per UR Regulation:  Reviewed for med. necessity/level of care/duration of stay  If discussed at Long Length of Stay Meetings, dates discussed:    Comments:  2/2 1007 debbie Zykia [place] rn,bsn spoke w pt. no ins at present. he did have ins and pcp when was w wife but now has no ins. gave pt inform on [place] [facility] and other [place] [facility] resource list.

## 2014-08-18 NOTE — Progress Notes (Signed)
CRITICAL VALUE ALERT  Critical value received:  Troponin: 0.61  Date of notification:  08/18/2014  Time of notification:  0224  Critical value read back:Yes.    Nurse who received alert:  Ellsworth Lennox  MD notified (1st page):  Dr. Tommi Rumps  Time of first page:  0224  Responding MD:  Dr. Tommi Rumps  Time MD responded:  [phone]

## 2014-08-18 NOTE — Interval H&P Note (Signed)
History and Physical Interval Note:  08/18/2014 10:51 AM  Donalee Citrin  has presented today for cardiac cath with with the diagnosis of non stemi  The various methods of treatment have been discussed with the patient and family. After consideration of risks, benefits and other options for treatment, the patient has consented to  Procedure(s): LEFT HEART CATHETERIZATION WITH CORONARY ANGIOGRAM (N/A) as a surgical intervention .  The patient's history has been reviewed, patient examined, no change in status, stable for surgery.  I have reviewed the patient's chart and labs.  Questions were answered to the patient's satisfaction.    Cath Lab Visit (complete for each Cath Lab visit)  Clinical Evaluation Leading to the Procedure:   ACS: Yes.    Non-ACS:    Anginal Classification: CCS IV  Anti-ischemic medical therapy: No Therapy  Non-Invasive Test Results: No non-invasive testing performed  Prior CABG: No previous CABG        MCALHANY,CHRISTOPHER

## 2014-08-18 NOTE — CV Procedure (Signed)
      Cardiac Catheterization Operative Report  Navy Belay [id number] 2/2/201610:55 AM No primary care provider on file.  Procedure Performed:  1. Left Heart Catheterization 2. Selective Coronary Angiography 3. Left ventricular angiogram  Operator: Lauree Chandler, MD  Primary Cardiologist: Dr. Irish Lack  Arterial access site:  Right radial artery.   Indication: 50 yo white male with history of tobacco abuse, HTN admitted with NSTEMI.                                     Procedure Details: The risks, benefits, complications, treatment options, and expected outcomes were discussed with the patient. The patient and/or family concurred with the proposed plan, giving informed consent. The patient was brought to the cath lab after IV hydration was begun and oral premedication was given. The patient was further sedated with Versed and Fentanyl. The right wrist was assessed with a modified Allens test which was positive. The right wrist was prepped and draped in a sterile fashion. 1% lidocaine was used for local anesthesia. Using the modified Seldinger access technique, a 5 French sheath was placed in the right radial artery. 3 mg Verapamil was given through the sheath. 4000 units IV heparin was given. Standard diagnostic catheters were used to perform selective coronary angiography. A pigtail catheter was used to perform a left ventricular angiogram. The sheath was removed from the right radial artery and a Terumo hemostasis band was applied at the arteriotomy site on the right wrist.   There were no immediate complications. The patient was taken to the recovery area in stable condition.   Hemodynamic Findings: Central aortic pressure: 128/63 Left ventricular pressure: 131/6/15  Angiographic Findings:  Left main: Normal caliber vessel that bifurcates into the LAD and Circumflex.   Left Anterior Descending Artery: Large caliber vessel that courses to the apex. The mid vessel has  a 20% stenosis. There is a moderate caliber diagonal branch with no disease. The mid LAD has a 40% stenosis just after the takeoff the takeoff of the diagonal branch. The distal LAD becomes small in caliber.  Circumflex Artery: Moderate caliber vessel with a smooth, mid 40% stenosis. There is a small to moderate caliber intermediate branch with no obstructive disease. The first obtuse marginal branch is very small.   Right Coronary Artery: Large dominant vessel with proximal 10% stenosis.   Left Ventricular Angiogram: LVEF=50%  Impression: 1. Mild non-obstructive CAD 2. Low normal LV systolic function 3. NSTEMI with no focal culprit lesion, possible coronary vasospasm.   Recommendations: Medical management of CAD. Smoking cessation. Echo later today. Probable discharge home in the am. Follow up with Dr. Irish Lack following discharge.        Complications:  None. The patient tolerated the procedure well.

## 2014-08-18 NOTE — Progress Notes (Signed)
ANTICOAGULATION CONSULT NOTE - Follow Up Consult  Pharmacy Consult for heparin  Indication: chest pain/ACS  No Known Allergies  Patient Measurements: Height: 6\' 2"  (188 cm) Weight: 192 lb (87.091 kg) IBW/kg (Calculated) : 82.2 Heparin Dosing Weight:   Vital Signs: Temp: 98 F (36.7 C) (02/01 2329) Temp Source: Oral (02/01 2329) BP: 114/62 mmHg (02/01 2329) Pulse Rate: 48 (02/01 2329)  Labs:  Recent Labs  08/17/14 1111 08/17/14 1432 08/17/14 2001 08/18/14 0050  HGB 17.2*  --   --  15.3  HCT 49.2  --   --  44.4  PLT 277  --   --  246  LABPROT  --   --  13.4  --   INR  --   --  1.01  --   HEPARINUNFRC  --   --   --  0.23*  CREATININE 1.09  --   --  1.24  TROPONINI  --  0.09* 0.44*  --     Estimated Creatinine Clearance: 82.9 mL/min (by C-G formula based on Cr of 1.24).   Medications:  Heparin infusing at 1150 units/hr   Assessment: First HL is subtherapeutic at 0.23 IU. No bleeding or infusion related issues noted.  Goal of Therapy:  Heparin level 0.3-0.7 units/ml Monitor platelets by anticoagulation protocol: Yes   Plan:  Increase heparin to 1350 units/hr  Recheck HL in 6 hours.  Curlene Dolphin 08/18/2014,2:14 AM

## 2014-08-18 NOTE — Progress Notes (Signed)
ANTICOAGULATION CONSULT NOTE - Follow Up Consult  Pharmacy Consult for heparin  Indication: chest pain/ACS  No Known Allergies  Patient Measurements: Height: 6\' 2"  (188 cm) Weight: 176 lb 5.9 oz (80 kg) IBW/kg (Calculated) : 82.2 Heparin Dosing Weight:   Vital Signs: Temp: 98.1 F (36.7 C) (02/02 0745) Temp Source: Oral (02/02 0745) BP: 139/66 mmHg (02/02 0800) Pulse Rate: 59 (02/02 0800)  Labs:  Recent Labs  08/17/14 1111  08/17/14 2001 08/18/14 0050 08/18/14 0758  HGB 17.2*  --   --  15.3  --   HCT 49.2  --   --  44.4  --   PLT 277  --   --  246  --   LABPROT  --   --  13.4  --   --   INR  --   --  1.01  --   --   HEPARINUNFRC  --   --   --  0.23* 0.29*  CREATININE 1.09  --   --  1.24  --   TROPONINI  --   < > 0.44* 0.61* 0.42*  < > = values in this interval not displayed.  Estimated Creatinine Clearance: 80.6 mL/min (by C-G formula based on Cr of 1.24).   Medications:  Heparin infusing at 1350 units/hr  Assessment: Kevin Ritter with no prior cardiac history [facility]. Labs were initially unremarkable but troponin now up slightly to 0.09. Pharmacy consulted to start IV heparin for ACS/NSTEMI. Hgb elevated, Plt wnl. Not on anticoagulation prior to admission   Heparin level this am just below goal at 0.29 on 1350 units/hr. No IV or bleeding issues noted. Plan is for cath later today.   Goal of Therapy:  Heparin level 0.3-0.7 units/ml Monitor platelets by anticoagulation protocol: Yes   Plan:  Increase heparin to 1450 units/hr  Follow up after cath  Erin Hearing PharmD., BCPS Clinical Pharmacist Pager [phone] 08/18/2014 9:09 AM

## 2014-08-19 ENCOUNTER — Encounter (HOSPITAL_COMMUNITY): Payer: Self-pay | Admitting: Cardiovascular Disease

## 2014-08-19 DIAGNOSIS — I201 Angina pectoris with documented spasm: Secondary | ICD-10-CM

## 2014-08-19 DIAGNOSIS — I214 Non-ST elevation (NSTEMI) myocardial infarction: Principal | ICD-10-CM

## 2014-08-19 LAB — BASIC METABOLIC PANEL
Anion gap: 5 (ref 5–15)
BUN: 9 mg/dL (ref 6–23)
CALCIUM: 8.5 mg/dL (ref 8.4–10.5)
CHLORIDE: 105 mmol/L (ref 96–112)
CO2: 28 mmol/L (ref 19–32)
CREATININE: 1.09 mg/dL (ref 0.50–1.35)
GFR calc Af Amer: 90 mL/min — ABNORMAL LOW (ref >90)
GFR, EST NON AFRICAN AMERICAN: 77 mL/min — AB (ref >90)
Glucose, Bld: 104 mg/dL — ABNORMAL HIGH (ref 70–99)
POTASSIUM: 4.7 mmol/L (ref 3.5–5.1)
Sodium: 138 mmol/L (ref 135–145)

## 2014-08-19 LAB — CBC
HCT: 44.6 % (ref 39.0–52.0)
Hemoglobin: 15.2 g/dL (ref 13.0–17.0)
MCH: 33.4 pg (ref 26.0–34.0)
MCHC: 34.1 g/dL (ref 30.0–36.0)
MCV: 98 fL (ref 78.0–100.0)
Platelets: 238 10*3/uL (ref 150–400)
RBC: 4.55 MIL/uL (ref 4.22–5.81)
RDW: 13.3 % (ref 11.5–15.5)
WBC: 9 10*3/uL (ref 4.0–10.5)

## 2014-08-19 LAB — HEMOGLOBIN A1C
HEMOGLOBIN A1C: 5.4 % (ref 4.8–5.6)
MEAN PLASMA GLUCOSE: 108 mg/dL

## 2014-08-19 MED ORDER — METOPROLOL TARTRATE 25 MG PO TABS: 12.5000 mg | ORAL_TABLET | Freq: Two times a day (BID) | ORAL | Status: DC

## 2014-08-19 MED ORDER — ATORVASTATIN CALCIUM 20 MG PO TABS: 20.0000 mg | ORAL_TABLET | Freq: Every day | ORAL | Status: DC

## 2014-08-19 MED ORDER — NITROGLYCERIN 0.4 MG SL SUBL: 0.4000 mg | SUBLINGUAL_TABLET | SUBLINGUAL | Status: DC | PRN

## 2014-08-19 MED ORDER — ASPIRIN 81 MG PO TBEC: 81.0000 mg | DELAYED_RELEASE_TABLET | Freq: Every day | ORAL | Status: DC

## 2014-08-19 MED ORDER — ISOSORBIDE MONONITRATE ER 30 MG PO TB24: 30.0000 mg | ORAL_TABLET | Freq: Every day | ORAL | Status: DC

## 2014-08-19 MED ORDER — CLOPIDOGREL BISULFATE 75 MG PO TABS: 75.0000 mg | ORAL_TABLET | Freq: Every day | ORAL | Status: DC

## 2014-08-19 NOTE — Discharge Summary (Signed)
Physician Discharge Summary     Cardiologist:  Christabelle Hanzlik(new)  Patient ID: Kevin Ritter MRN: [id number] DOB/AGE: Apr 22, 1966 49 y.o.  Admit date: 08/17/2014 Discharge date: 08/19/2014  Admission Diagnoses: Unstable angina, NSTEMI  Discharge Diagnoses:  Active Problems:   Unstable angina   NSTEMI (non-ST elevated myocardial infarction)   Tobacco abuse   HTN  Discharged Condition: stable  Hospital Course:  [name] is a 49 y/o M with no prior cardiac history but a history of habitual tobacco/EtOH abuse (6 pack per day), occasional marijuana use, HTN (out of meds for 2 weeks) who presented to [facility] today with chest pain. He recently separated from his wife and reports financial constraints thus stopped taking his blood pressure medications. He continues to smoke and drink alcohol. 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 He notified his boss and subsequently left to come to the ER. Here he received 3 SL NTG with relief of pain down to a 0-1/10 and then 325mg  ASA. CXR nonacute. Labs remarkable for troponin 0.00 -> then mildly positive at 0.09. Hgb 17.2, glucose 177. BP elevated in the 924Q-683M systolic. He does not exercise but states his job is fairly exertional - denies recent chest pain or DOE. He denies any SOB, bleeding, LEE, lightheadedness or syncope.  Tele in ER reveals sinus bradycardia mostly HR 40s-50s, one dip into the upper 30s, brief instance of increased ectopy with couplets and triplet, and one episode of brief irregularity (still sinus) with a P wave then dropped QRS - varying P wave morphologies circumferential to this event, not clearly a Wenckebach event. He was not symptomatic with this event.  The patient was admitted and started on IV heparin.  Troponin peaked at 0.61.  He was started on  lopressor 25mg  BID which was later decreased to 12.5BID so we can add Imdur 30mg . Statin added.   He also had some episodes of bradycardia as noted above.  This was done because the LHC revealed mild non-obstructive CAD and no focal culprit lesion.  NSTEMI from possible coronary vasospasm. Low normal LV systolic function. Plavix was also added.  Tobacco cessation counseling provided(He's been smoking since age 50).  The patient was seen by Dr. Irish Lack who felt he was stable for DC home.     Consults: Pharmacy  Significant Diagnostic Studies:  Lipid Panel     Component Value Date/Time   CHOL 194 08/18/2014 0050   TRIG 110 08/18/2014 0050   HDL 64 08/18/2014 0050   CHOLHDL 3.0 08/18/2014 0050   VLDL 22 08/18/2014 0050   LDLCALC 108* 08/18/2014 0050      Cardiac Catheterization Operative Report  Amen Staszak [id number] 2/2/201610:55 AM No primary care provider on file.  Procedure Performed:  1. Left Heart Catheterization 2. Selective Coronary Angiography 3. Left ventricular angiogram  Operator: Lauree Chandler, MD  Primary Cardiologist: Dr. Irish Lack  Arterial access site: Right radial artery.   Indication: 49 yo white male with history of tobacco abuse, HTN admitted with NSTEMI.   Procedure Details: The risks, benefits, complications, treatment options, and expected outcomes were discussed with the patient. The patient and/or family concurred with the proposed plan, giving informed consent. The patient was brought to the cath lab after IV hydration was begun and oral premedication was given. The patient was further sedated with Versed and Fentanyl.  The right wrist was assessed with a modified Allens test which was positive. The right wrist was prepped and draped in a sterile fashion. 1% lidocaine was used for local anesthesia. Using the modified Seldinger access technique, a 5 French sheath was placed in the right radial artery. 3 mg  Verapamil was given through the sheath. 4000 units IV heparin was given. Standard diagnostic catheters were used to perform selective coronary angiography. A pigtail catheter was used to perform a left ventricular angiogram. The sheath was removed from the right radial artery and a Terumo hemostasis band was applied at the arteriotomy site on the right wrist.   There were no immediate complications. The patient was taken to the recovery area in stable condition.   Hemodynamic Findings: Central aortic pressure: 128/63 Left ventricular pressure: 131/6/15  Angiographic Findings:  Left main: Normal caliber vessel that bifurcates into the LAD and Circumflex.   Left Anterior Descending Artery: Large caliber vessel that courses to the apex. The mid vessel has a 20% stenosis. There is a moderate caliber diagonal branch with no disease. The mid LAD has a 40% stenosis just after the takeoff the takeoff of the diagonal branch. The distal LAD becomes small in caliber.  Circumflex Artery: Moderate caliber vessel with a smooth, mid 40% stenosis. There is a small to moderate caliber intermediate branch with no obstructive disease. The first obtuse marginal branch is very small.   Right Coronary Artery: Large dominant vessel with proximal 10% stenosis.   Left Ventricular Angiogram: LVEF=50%  Impression: 1. Mild non-obstructive CAD 2. Low normal LV systolic function 3. NSTEMI with no focal culprit lesion, possible coronary vasospasm.   Recommendations: Medical management of CAD. Smoking cessation. Echo later today. Probable discharge home in the am. Follow up with Dr. Irish Lack following discharge.    Complications: None. The patient tolerated the procedure well.     Treatments: See above  Discharge Exam: Blood pressure 114/55, pulse 43, temperature 97.9 F (36.6 C), temperature source Oral, resp. rate 18, height 6\' 2"  (1.88 m), weight 176 lb 5.9 oz (80 kg), SpO2 99  %.   Disposition: Final discharge disposition not confirmed  Discharge Instructions    Diet - low sodium heart healthy    Complete by:  As directed      Discharge instructions    Complete by:  As directed   No lifting with right arm for three days.     Increase activity slowly    Complete by:  As directed             Medication List    STOP taking these medications        ibuprofen 200 MG tablet  Commonly known as:  ADVIL,MOTRIN     losartan 25 MG tablet  Commonly known as:  COZAAR      TAKE these medications        aspirin 81 MG EC tablet  Take 1 tablet (81 mg total) by mouth daily.     atorvastatin 20 MG tablet  Commonly known as:  LIPITOR  Take 1 tablet (20 mg total) by mouth daily at 6 PM.     clopidogrel 75 MG tablet  Commonly known as:  PLAVIX  Take 1 tablet (75 mg total) by mouth daily.     isosorbide mononitrate 30 MG 24 hr tablet  Commonly known as:  IMDUR  Take 1 tablet (30 mg total) by mouth daily.     metoprolol tartrate 25 MG tablet  Commonly known  as:  LOPRESSOR  Take 0.5 tablets (12.5 mg total) by mouth 2 (two) times daily.     nitroGLYCERIN 0.4 MG SL tablet  Commonly known as:  NITROSTAT  Place 1 tablet (0.4 mg total) under the tongue every 5 (five) minutes x 3 doses as needed for chest pain.           Follow-up Information    Follow up with [facility].   Specialty:  Emergency Medicine   Why:  If symptoms worsen   Contact information:   [address] [id number] [place] [phone]      Follow up with Truitt Merle, NP. Schedule an appointment as soon as possible for a visit on 09/08/2014.   Specialty:  Nurse Practitioner   Why:  10:30AM   Contact information:   [place] [place] [address] [phone]      Greater than 30 minutes was spent completing the patient's discharge.   SignedTarri Fuller, [place] 08/19/2014, 11:43 AM     I have examined the patient and reviewed assessment and plan and discussed with patient. Agree with above as stated. Possible vasospasm as cause of positive troponin. Will start Imdur. Decrease beta blocker due to bradycardia. Plavix is reasonable given moderate disease and positive troponin, but not mandatory. OK to d/c.  Gara Kincade S.

## 2014-08-19 NOTE — Progress Notes (Signed)
Subjective: Feels better  Objective: Vital signs in last 24 hours: Temp:  [97.9 F (36.6 C)-98.3 F (36.8 C)] 97.9 F (36.6 C) (02/03 0508) Pulse Rate:  [43-66] 43 (02/03 0508) Resp:  [9-21] 18 (02/03 0508) BP: (114-149)/(55-76) 114/55 mmHg (02/03 0508) SpO2:  [90 %-99 %] 99 % (02/03 0508) Last BM Date: 08/19/14  Intake/Output from previous day: 02/02 0701 - 02/03 0700 In: 1215.4 [P.O.:780; I.V.:435.4] Out: 525 [Urine:525] Intake/Output this shift: Total I/O In: 480 [P.O.:480] Out: -   Medications Current Facility-Administered Medications  Medication Dose Route Frequency Provider Last Rate Last Dose  . 0.9 %  sodium chloride infusion  250 mL Intravenous PRN Dayna N Dunn, PA-C      . acetaminophen (TYLENOL) tablet 650 mg  650 mg Oral Q4H PRN Dayna N Dunn, PA-C      . aspirin EC tablet 81 mg  81 mg Oral Daily Dayna N Dunn, PA-C   81 mg at 08/19/14 1018  . atorvastatin (LIPITOR) tablet 20 mg  20 mg Oral q1800 Dayna N Dunn, PA-C   20 mg at 08/18/14 2015  . clopidogrel (PLAVIX) tablet 75 mg  75 mg Oral Daily Burnell Blanks, MD   75 mg at 08/19/14 1019  . folic acid (FOLVITE) tablet 1 mg  1 mg Oral Daily Dayna N Dunn, PA-C   1 mg at 08/19/14 1018  . LORazepam (ATIVAN) tablet 1 mg  1 mg Oral Q6H PRN Dayna N Dunn, PA-C       Or  . LORazepam (ATIVAN) injection 1 mg  1 mg Intravenous Q6H PRN Dayna N Dunn, PA-C      . LORazepam (ATIVAN) tablet 0-4 mg  0-4 mg Oral Q6H Dayna N Dunn, PA-C   1 mg at 08/18/14 1325   Followed by  . LORazepam (ATIVAN) tablet 0-4 mg  0-4 mg Oral Q12H Dayna N Dunn, PA-C      . metoprolol tartrate (LOPRESSOR) tablet 25 mg  25 mg Oral BID Burnell Blanks, MD   25 mg at 08/19/14 1019  . multivitamin with minerals tablet 1 tablet  1 tablet Oral Daily Dayna N Dunn, PA-C   1 tablet at 08/19/14 1018  . nitroGLYCERIN (NITROSTAT) SL tablet 0.4 mg  0.4 mg Sublingual Q5 Min x 3 PRN Dayna N Dunn, PA-C      . ondansetron (ZOFRAN) injection 4 mg  4 mg  Intravenous Q6H PRN Dayna N Dunn, PA-C      . sodium chloride 0.9 % injection 3 mL  3 mL Intravenous Q12H Dayna N Dunn, PA-C   3 mL at 08/18/14 2210  . sodium chloride 0.9 % injection 3 mL  3 mL Intravenous PRN Dayna N Dunn, PA-C      . thiamine (VITAMIN B-1) tablet 100 mg  100 mg Oral Daily Dayna N Dunn, PA-C   100 mg at 08/19/14 1018   Or  . thiamine (B-1) injection 100 mg  100 mg Intravenous Daily Dayna N Dunn, PA-C        PE: General appearance: alert, cooperative and no distress Lungs: clear to auscultation bilaterally Heart: regular rate and rhythm, S1, S2 normal, no murmur, click, rub or gallop Extremities: No LEE Pulses: 2+ and symmetric Skin: Warm and dry.  Radial cath site stable.  Neurologic: Grossly normal  Lab Results:   Recent Labs  08/17/14 1111 08/18/14 0050 08/19/14 0342  WBC 9.7 8.9 9.0  HGB 17.2* 15.3 15.2  HCT 49.2 44.4 44.6  PLT  277 246 238   BMET  Recent Labs  08/17/14 1111 08/18/14 0050 08/19/14 0342  NA 139 136 138  K 3.8 3.6 4.7  CL 105 103 105  CO2 24 24 28   GLUCOSE 177* 110* 104*  BUN 11 13 9   CREATININE 1.09 1.24 1.09  CALCIUM 8.8 9.0 8.5   PT/INR  Recent Labs  08/17/14 2001  LABPROT 13.4  INR 1.01   Cholesterol  Recent Labs  08/18/14 0050  CHOL 194   Lipid Panel     Component Value Date/Time   CHOL 194 08/18/2014 0050   TRIG 110 08/18/2014 0050   HDL 64 08/18/2014 0050   CHOLHDL 3.0 08/18/2014 0050   VLDL 22 08/18/2014 0050   LDLCALC 108* 08/18/2014 0050    Cardiac Panel (last 3 results)  Recent Labs  08/17/14 2001 08/18/14 0050 08/18/14 0758  TROPONINI 0.44* 0.61* 0.42*      Assessment/Plan  Active Problems:   Unstable angina   NSTEMI (non-ST elevated myocardial infarction)   Tobacco abuse   SP left heart cath revealing mild non-obstructive CAD.  Low normal LV systolic function. NSTEMI with no focal culprit lesion, possible coronary vasospasm.  May need amlodipine in the future.  Troponin peaked  at 0.61.  I think we can stop the plavix.  On ASA 81mg , statin. Bp controlled.  Lopressor 25 mg bid.  Bradycardic on exam.  He is already checked out from the telemetry.  I can not review data.  No aortic aneurysm on abd [place].   Thomas for DC.  Tobacco cessation discussed.   LOS: 2 days    HAGER, BRYAN PA-C 08/19/2014 10:41 AM  I have examined the patient and reviewed assessment and plan and discussed with patient.  Agree with above as stated.  Possible vasospasm as cause of positive troponin.  Will start Imdur.  Decrease beta blocker due to bradycardia.  Plavix is reasonable given moderate disease and positive troponin, but not mandatory.  OK to d/c.  VARANASI,JAYADEEP S.

## 2014-09-08 ENCOUNTER — Encounter: Payer: Self-pay | Admitting: Nurse Practitioner

## 2015-09-17 DIAGNOSIS — M1711 Unilateral primary osteoarthritis, right knee: Secondary | ICD-10-CM | POA: Insufficient documentation

## 2015-09-17 DIAGNOSIS — I2510 Atherosclerotic heart disease of native coronary artery without angina pectoris: Secondary | ICD-10-CM | POA: Insufficient documentation

## 2015-09-17 DIAGNOSIS — M25552 Pain in left hip: Secondary | ICD-10-CM | POA: Insufficient documentation

## 2015-09-17 DIAGNOSIS — J449 Chronic obstructive pulmonary disease, unspecified: Secondary | ICD-10-CM | POA: Insufficient documentation

## 2015-09-17 DIAGNOSIS — M1712 Unilateral primary osteoarthritis, left knee: Secondary | ICD-10-CM | POA: Insufficient documentation

## 2015-09-17 DIAGNOSIS — N400 Enlarged prostate without lower urinary tract symptoms: Secondary | ICD-10-CM | POA: Insufficient documentation

## 2016-07-10 ENCOUNTER — Emergency Department (HOSPITAL_COMMUNITY)
Admission: EM | Admit: 2016-07-10 | Discharge: 2016-07-10 | Disposition: A | Discharge status: Home / Self Care | Payer: Self-pay | Attending: Emergency Medicine | Admitting: Emergency Medicine

## 2016-07-10 ENCOUNTER — Emergency Department (HOSPITAL_COMMUNITY): Payer: Self-pay

## 2016-07-10 ENCOUNTER — Encounter (HOSPITAL_COMMUNITY): Payer: Self-pay | Admitting: Emergency Medicine

## 2016-07-10 DIAGNOSIS — Z7982 Long term (current) use of aspirin: Secondary | ICD-10-CM | POA: Insufficient documentation

## 2016-07-10 DIAGNOSIS — Z79899 Other long term (current) drug therapy: Secondary | ICD-10-CM | POA: Insufficient documentation

## 2016-07-10 DIAGNOSIS — J069 Acute upper respiratory infection, unspecified: Secondary | ICD-10-CM | POA: Insufficient documentation

## 2016-07-10 DIAGNOSIS — F17200 Nicotine dependence, unspecified, uncomplicated: Secondary | ICD-10-CM | POA: Insufficient documentation

## 2016-07-10 DIAGNOSIS — I252 Old myocardial infarction: Secondary | ICD-10-CM | POA: Insufficient documentation

## 2016-07-10 DIAGNOSIS — K2900 Acute gastritis without bleeding: Secondary | ICD-10-CM | POA: Insufficient documentation

## 2016-07-10 DIAGNOSIS — I10 Essential (primary) hypertension: Secondary | ICD-10-CM | POA: Insufficient documentation

## 2016-07-10 LAB — COMPREHENSIVE METABOLIC PANEL
ALT: 24 U/L (ref 17–63)
AST: 28 U/L (ref 15–41)
Albumin: 4 g/dL (ref 3.5–5.0)
Alkaline Phosphatase: 82 U/L (ref 38–126)
Anion gap: 7 (ref 5–15)
BUN: 8 mg/dL (ref 6–20)
CO2: 24 mmol/L (ref 22–32)
Calcium: 9 mg/dL (ref 8.9–10.3)
Chloride: 104 mmol/L (ref 101–111)
Creatinine, Ser: 1.15 mg/dL (ref 0.61–1.24)
Glucose, Bld: 101 mg/dL — ABNORMAL HIGH (ref 65–99)
Potassium: 4.5 mmol/L (ref 3.5–5.1)
Sodium: 135 mmol/L (ref 135–145)
Total Bilirubin: 0.5 mg/dL (ref 0.3–1.2)
Total Protein: 6.7 g/dL (ref 6.5–8.1)

## 2016-07-10 LAB — CBC
HCT: 49.5 % (ref 39.0–52.0)
HEMOGLOBIN: 17.9 g/dL — AB (ref 13.0–17.0)
MCH: 34.6 pg — ABNORMAL HIGH (ref 26.0–34.0)
MCHC: 36.2 g/dL — ABNORMAL HIGH (ref 30.0–36.0)
MCV: 95.6 fL (ref 78.0–100.0)
PLATELETS: 248 10*3/uL (ref 150–400)
RBC: 5.18 MIL/uL (ref 4.22–5.81)
RDW: 12.9 % (ref 11.5–15.5)
WBC: 7.2 10*3/uL (ref 4.0–10.5)

## 2016-07-10 LAB — TYPE AND SCREEN
ABO/RH(D): A POS
Antibody Screen: NEGATIVE

## 2016-07-10 LAB — TROPONIN I

## 2016-07-10 LAB — POC OCCULT BLOOD, ED: Fecal Occult Bld: NEGATIVE

## 2016-07-10 LAB — ABO/RH: ABO/RH(D): A POS

## 2016-07-10 LAB — LIPASE, BLOOD: LIPASE: 43 U/L (ref 11–51)

## 2016-07-10 MED ORDER — ONDANSETRON HCL 4 MG PO TABS: 4.0000 mg | ORAL_TABLET | Freq: Four times a day (QID) | ORAL | 0 refills | Status: DC | PRN

## 2016-07-10 MED ORDER — LANSOPRAZOLE 30 MG PO CPDR: 30.0000 mg | DELAYED_RELEASE_CAPSULE | Freq: Every day | ORAL | 0 refills | Status: DC

## 2016-07-10 MED ORDER — GI COCKTAIL ~~LOC~~
30.0000 mL | Freq: Once | ORAL | Status: AC
  Administered 2016-07-10: 30 mL via ORAL
  Filled 2016-07-10: qty 30

## 2016-07-10 MED ORDER — ALBUTEROL SULFATE HFA 108 (90 BASE) MCG/ACT IN AERS: 1.0000 | INHALATION_SPRAY | RESPIRATORY_TRACT | 0 refills | Status: AC | PRN

## 2016-07-10 MED ORDER — BENZONATATE 100 MG PO CAPS: 100.0000 mg | ORAL_CAPSULE | Freq: Three times a day (TID) | ORAL | 0 refills | Status: DC | PRN

## 2016-07-10 NOTE — ED Notes (Signed)
Patient stated he has ran out of his blood pressure medication a long time ago and needs a refill.

## 2016-07-10 NOTE — ED Notes (Signed)
Gave pt Coke Cola, per Dr. Lita Mains.

## 2016-07-10 NOTE — ED Provider Notes (Signed)
[facility] DEPT Provider Note   CSN: [id number] Arrival date & time: 07/10/16  1112     History   Chief Complaint Chief Complaint  Patient presents with  . Abdominal Pain  . Rectal Bleeding    HPI Kevin Ritter is a 50 y.o. male.  HPI Patient presents with upper abdominal pain worse after eating. States this is been present for the last 3 weeks. Describes the pain as burning. No nausea or vomiting. States he does have some loose stools and has noticed occasional bright blood in the stool. Denies any fever or chills. Endorses nonproductive cough for last few nights that is steadily worsened. No chest pain or shortness of breath. Past Medical History:  Diagnosis Date  . Arthritis   . ETOH abuse   . Financial difficulty   . Hypertension   . Tobacco abuse     Patient Active Problem List   Diagnosis Date Noted  . NSTEMI (non-ST elevated myocardial infarction) ([place]) 08/18/2014  . Unstable angina ([place]) 08/17/2014  . Financial difficulty   . Hypertension   . Arthritis   . Tobacco abuse   . ETOH abuse     Past Surgical History:  Procedure Laterality Date  . LEFT HEART CATHETERIZATION WITH CORONARY ANGIOGRAM N/A 08/18/2014   Procedure: LEFT HEART CATHETERIZATION WITH CORONARY ANGIOGRAM;  Surgeon: Burnell Blanks, MD;  [facility];  Service: Cardiovascular;  Laterality: N/A;  . SHOULDER ARTHROSCOPY     left       Home Medications    Prior to Admission medications   Medication Sig Start Date End Date Taking? Authorizing Provider  albuterol (PROVENTIL HFA;VENTOLIN HFA) 108 (90 Base) MCG/ACT inhaler Inhale 1-2 puffs into the lungs every 4 (four) hours as needed for wheezing or shortness of breath. 07/10/16   Julianne Rice, MD  aspirin EC 81 MG EC tablet Take 1 tablet (81 mg total) by mouth daily. Patient not taking: Reported on 07/10/2016 08/19/14   Brett Canales, PA-C  atorvastatin (LIPITOR) 20 MG tablet Take 1 tablet (20 mg total) by mouth daily  at 6 PM. Patient not taking: Reported on 07/10/2016 08/19/14   Brett Canales, PA-C  benzonatate (TESSALON) 100 MG capsule Take 1 capsule (100 mg total) by mouth 3 (three) times daily as needed for cough. 07/10/16   Julianne Rice, MD  clopidogrel (PLAVIX) 75 MG tablet Take 1 tablet (75 mg total) by mouth daily. Patient not taking: Reported on 07/10/2016 08/19/14   Brett Canales, PA-C  isosorbide mononitrate (IMDUR) 30 MG 24 hr tablet Take 1 tablet (30 mg total) by mouth daily. Patient not taking: Reported on 07/10/2016 08/19/14   Brett Canales, PA-C  lansoprazole (PREVACID) 30 MG capsule Take 1 capsule (30 mg total) by mouth daily at 12 noon. 07/10/16   Julianne Rice, MD  metoprolol tartrate (LOPRESSOR) 25 MG tablet Take 0.5 tablets (12.5 mg total) by mouth 2 (two) times daily. Patient not taking: Reported on 07/10/2016 08/19/14   Brett Canales, PA-C  nitroGLYCERIN (NITROSTAT) 0.4 MG SL tablet Place 1 tablet (0.4 mg total) under the tongue every 5 (five) minutes x 3 doses as needed for chest pain. 08/19/14   Brett Canales, PA-C  ondansetron (ZOFRAN) 4 MG tablet Take 1 tablet (4 mg total) by mouth every 6 (six) hours as needed for nausea or vomiting. 07/10/16   Julianne Rice, MD    Family History Family History  Problem Relation Age of Onset  . Hypertension    .  CAD Neg Hx     Social History Social History  Substance Use Topics  . Smoking status: Current Every Day Smoker  . Smokeless tobacco: Current User     Comment: Since age 87-7.   Marland Kitchen Alcohol use 0.0 oz/week     Comment: 6 pack of beer every night     Allergies   Patient has no known allergies.   Review of Systems Review of Systems  Constitutional: Negative for chills and fever.  Respiratory: Positive for cough. Negative for shortness of breath and wheezing.   Cardiovascular: Negative for chest pain, palpitations and leg swelling.  Gastrointestinal: Positive for abdominal distention, abdominal pain, blood in stool and diarrhea.  Negative for constipation, nausea and vomiting.  Genitourinary: Negative for dysuria, flank pain, frequency and hematuria.  Musculoskeletal: Negative for back pain, myalgias, neck pain and neck stiffness.  Skin: Negative for rash and wound.  Neurological: Negative for dizziness, weakness, light-headedness, numbness and headaches.  All other systems reviewed and are negative.    Physical Exam Updated Vital Signs BP 148/73   Pulse (!) 58   Temp 97.8 F (36.6 C) (Oral)   Resp 18   SpO2 96%   Physical Exam  Constitutional: He is oriented to person, place, and time. He appears well-developed and well-nourished. No distress.  HENT:  Head: Normocephalic and atraumatic.  Mouth/Throat: Oropharynx is clear and moist. No oropharyngeal exudate.  Eyes: EOM are normal. Pupils are equal, round, and reactive to light.  Neck: Normal range of motion. Neck supple. No JVD present.  Cardiovascular: Normal rate and regular rhythm.  Exam reveals no gallop and no friction rub.   No murmur heard. Pulmonary/Chest: Effort normal and breath sounds normal. No respiratory distress. He has no wheezes. He has no rales. He exhibits no tenderness.  Abdominal: Soft. Bowel sounds are normal. He exhibits distension (mildly distended). There is tenderness (diffusely tender to palpation but most pronounced in the epigastric region). There is no rebound and no guarding.  Genitourinary:  Genitourinary Comments: Brown stool. No obvious fissures or hemorrhoids.  Musculoskeletal: Normal range of motion. He exhibits no edema or tenderness.  No lower extremity swelling, asymmetry or tenderness.  Neurological: He is alert and oriented to person, place, and time.  Moving all extremities without deficit. Sensation intact.  Skin: Skin is warm and dry. Capillary refill takes less than 2 seconds. No rash noted. No erythema.  Psychiatric: He has a normal mood and affect. His behavior is normal.  Nursing note and vitals  reviewed.    ED Treatments / Results  Labs (all labs ordered are listed, but only abnormal results are displayed) Labs Reviewed  COMPREHENSIVE METABOLIC PANEL - Abnormal; Notable for the following:       Result Value   Glucose, Bld 101 (*)    All other components within normal limits  CBC - Abnormal; Notable for the following:    Hemoglobin 17.9 (*)    MCH 34.6 (*)    MCHC 36.2 (*)    All other components within normal limits  LIPASE, BLOOD  TROPONIN I  POC OCCULT BLOOD, ED  TYPE AND SCREEN  ABO/RH    EKG  EKG Interpretation  Date/Time:  Monday July 10 2016 11:52:44 EST Ventricular Rate:  61 PR Interval:    QRS Duration: 91 QT Interval:  404 QTC Calculation: 407 R Axis:   66 Text Interpretation:  Sinus rhythm Anteroseptal infarct, old ST elevation, consider inferior injury Baseline wander in lead(s) V4 No significant change  since last tracing Confirmed by [facility]  MD, Mercedies Ganesh ([id number]) on 07/10/2016 12:04:08 PM       Radiology Dg Abd Acute W/chest  Result Date: 07/10/2016 CLINICAL DATA:  Abdominal pain and burning sensation for but 3 weeks with cough. EXAM: DG ABDOMEN ACUTE W/ 1V CHEST COMPARISON:  None. FINDINGS: There is no evidence of dilated bowel loops or free intraperitoneal air. No radiopaque calculi or other significant radiographic abnormality is seen. Heart size and mediastinal contours are within normal limits. Both lungs are clear. IMPRESSION: Negative abdominal radiographs.  No acute cardiopulmonary disease. Electronically Signed   By: Fidela Salisbury M.D.   On: 07/10/2016 12:18    Procedures Procedures (including critical care time)  Medications Ordered in ED Medications  gi cocktail (Maalox,Lidocaine,Donnatal) (30 mLs Oral Given 07/10/16 1147)     Initial Impression / Assessment and Plan / ED Course  I have reviewed the triage vital signs and the nursing notes.  Pertinent labs & imaging results that were available during my care of the  patient were reviewed by me and considered in my medical decision making (see chart for details).  Clinical Course    Repeat abdominal exam is benign. Patient is eating a sandwich. Suspect symptoms related to alcohol-induced gastritis. Will start on PPI and advise follow-up with GI. Patient encouraged to decrease alcohol consumption and avoid NSAIDs and spicy/acidic foods. Return precautions given.   Final Clinical Impressions(s) / ED Diagnoses   Final diagnoses:  Acute superficial gastritis without hemorrhage  Upper respiratory tract infection, unspecified type    New Prescriptions New Prescriptions   ALBUTEROL (PROVENTIL HFA;VENTOLIN HFA) 108 (90 BASE) MCG/ACT INHALER    Inhale 1-2 puffs into the lungs every 4 (four) hours as needed for wheezing or shortness of breath.   BENZONATATE (TESSALON) 100 MG CAPSULE    Take 1 capsule (100 mg total) by mouth 3 (three) times daily as needed for cough.   LANSOPRAZOLE (PREVACID) 30 MG CAPSULE    Take 1 capsule (30 mg total) by mouth daily at 12 noon.   ONDANSETRON (ZOFRAN) 4 MG TABLET    Take 1 tablet (4 mg total) by mouth every 6 (six) hours as needed for nausea or vomiting.     Julianne Rice, MD 07/10/16 [phone]

## 2016-07-10 NOTE — ED Notes (Signed)
Gave patient [place] meal.

## 2016-07-10 NOTE — ED Triage Notes (Signed)
Pt states "for about three weeks, my stomach has been hurting really bad, and I have a burning sensation in it" Pt also c/o cough. Pt states hes noticed bright red blood in my stool a few times. Pt states he had some blood in his underwear. Pt states the blood is coming from his rectum. Pt states his girlfriend has HIV and hes sexually active with her.

## 2016-08-28 ENCOUNTER — Encounter (HOSPITAL_COMMUNITY): Payer: Self-pay

## 2016-08-28 ENCOUNTER — Emergency Department (HOSPITAL_COMMUNITY)
Admission: EM | Admit: 2016-08-28 | Discharge: 2016-08-28 | Disposition: A | Discharge status: Home / Self Care | Payer: Self-pay | Attending: Emergency Medicine | Admitting: Emergency Medicine

## 2016-08-28 ENCOUNTER — Emergency Department (HOSPITAL_COMMUNITY)
Admission: EM | Admit: 2016-08-28 | Discharge: 2016-08-28 | Disposition: A | Discharge status: Home / Self Care | Payer: Self-pay

## 2016-08-28 DIAGNOSIS — Z7982 Long term (current) use of aspirin: Secondary | ICD-10-CM | POA: Insufficient documentation

## 2016-08-28 DIAGNOSIS — Z79899 Other long term (current) drug therapy: Secondary | ICD-10-CM | POA: Insufficient documentation

## 2016-08-28 DIAGNOSIS — I10 Essential (primary) hypertension: Secondary | ICD-10-CM | POA: Insufficient documentation

## 2016-08-28 DIAGNOSIS — F17200 Nicotine dependence, unspecified, uncomplicated: Secondary | ICD-10-CM | POA: Insufficient documentation

## 2016-08-28 DIAGNOSIS — I252 Old myocardial infarction: Secondary | ICD-10-CM | POA: Insufficient documentation

## 2016-08-28 DIAGNOSIS — M5442 Lumbago with sciatica, left side: Secondary | ICD-10-CM | POA: Insufficient documentation

## 2016-08-28 DIAGNOSIS — M5441 Lumbago with sciatica, right side: Secondary | ICD-10-CM | POA: Insufficient documentation

## 2016-08-28 LAB — URINALYSIS, ROUTINE W REFLEX MICROSCOPIC
Bacteria, UA: NONE SEEN
Bilirubin Urine: NEGATIVE
GLUCOSE, UA: NEGATIVE mg/dL
Hgb urine dipstick: NEGATIVE
Ketones, ur: NEGATIVE mg/dL
Nitrite: NEGATIVE
Protein, ur: NEGATIVE mg/dL
Specific Gravity, Urine: 1.013 (ref 1.005–1.030)
Squamous Epithelial / LPF: NONE SEEN
pH: 5 (ref 5.0–8.0)

## 2016-08-28 MED ORDER — METHOCARBAMOL 500 MG PO TABS: 500.0000 mg | ORAL_TABLET | Freq: Three times a day (TID) | ORAL | 0 refills | Status: DC | PRN

## 2016-08-28 MED ORDER — KETOROLAC TROMETHAMINE 30 MG/ML IJ SOLN
30.0000 mg | Freq: Once | INTRAMUSCULAR | Status: AC
  Administered 2016-08-28: 30 mg via INTRAMUSCULAR
  Filled 2016-08-28: qty 1

## 2016-08-28 MED ORDER — METHOCARBAMOL 500 MG PO TABS
500.0000 mg | ORAL_TABLET | Freq: Once | ORAL | Status: AC
  Administered 2016-08-28: 500 mg via ORAL
  Filled 2016-08-28: qty 1

## 2016-08-28 NOTE — ED Triage Notes (Signed)
Per EMS - pt reports sudden onset lower back pain starting yesterday. Pain worse w/ palpation. No preceding injuries.

## 2016-08-28 NOTE — ED Provider Notes (Signed)
[facility] DEPT Provider Note   CSN: [id number] Arrival date & time: 08/28/16  0809     History   Chief Complaint Chief Complaint  Patient presents with  . Back Pain    HPI Kevin Ritter is a 51 y.o. male.  HPI Patient presents with back pain. Began last night. Worse with movement. States he had been playing darts and doing fine but then was some beers with a friend when he was sitting down and had pain in his lower back. Goes from the lower back to both sides. Now states it goes down the legs. No weakness. No loss of bladder bowel control. No fevers. No history of injection drug use. No hematuria. No difficulty urinating. No abdominal pain.   Past Medical History:  Diagnosis Date  . Arthritis   . ETOH abuse   . Financial difficulty   . Hypertension   . Tobacco abuse     Patient Active Problem List   Diagnosis Date Noted  . NSTEMI (non-ST elevated myocardial infarction) ([place]) 08/18/2014  . Unstable angina ([place]) 08/17/2014  . Financial difficulty   . Hypertension   . Arthritis   . Tobacco abuse   . ETOH abuse     Past Surgical History:  Procedure Laterality Date  . LEFT HEART CATHETERIZATION WITH CORONARY ANGIOGRAM N/A 08/18/2014   Procedure: LEFT HEART CATHETERIZATION WITH CORONARY ANGIOGRAM;  Surgeon: Burnell Blanks, MD;  Location: [facility] CATH LAB;  Service: Cardiovascular;  Laterality: N/A;  . SHOULDER ARTHROSCOPY     left       Home Medications    Prior to Admission medications   Medication Sig Start Date End Date Taking? Authorizing Provider  albuterol (PROVENTIL HFA;VENTOLIN HFA) 108 (90 Base) MCG/ACT inhaler Inhale 1-2 puffs into the lungs every 4 (four) hours as needed for wheezing or shortness of breath. 07/10/16   Julianne Rice, MD  aspirin EC 81 MG EC tablet Take 1 tablet (81 mg total) by mouth daily. Patient not taking: Reported on 07/10/2016 08/19/14   Brett Canales, PA-C  atorvastatin (LIPITOR) 20 MG tablet Take 1 tablet (20 mg total)  by mouth daily at 6 PM. Patient not taking: Reported on 07/10/2016 08/19/14   Brett Canales, PA-C  benzonatate (TESSALON) 100 MG capsule Take 1 capsule (100 mg total) by mouth 3 (three) times daily as needed for cough. 07/10/16   Julianne Rice, MD  clopidogrel (PLAVIX) 75 MG tablet Take 1 tablet (75 mg total) by mouth daily. Patient not taking: Reported on 07/10/2016 08/19/14   Brett Canales, PA-C  isosorbide mononitrate (IMDUR) 30 MG 24 hr tablet Take 1 tablet (30 mg total) by mouth daily. Patient not taking: Reported on 07/10/2016 08/19/14   Brett Canales, PA-C  lansoprazole (PREVACID) 30 MG capsule Take 1 capsule (30 mg total) by mouth daily at 12 noon. 07/10/16   Julianne Rice, MD  methocarbamol (ROBAXIN) 500 MG tablet Take 1 tablet (500 mg total) by mouth every 8 (eight) hours as needed for muscle spasms. 08/28/16   Davonna Belling, MD  metoprolol tartrate (LOPRESSOR) 25 MG tablet Take 0.5 tablets (12.5 mg total) by mouth 2 (two) times daily. Patient not taking: Reported on 07/10/2016 08/19/14   Brett Canales, PA-C  nitroGLYCERIN (NITROSTAT) 0.4 MG SL tablet Place 1 tablet (0.4 mg total) under the tongue every 5 (five) minutes x 3 doses as needed for chest pain. 08/19/14   Brett Canales, PA-C  ondansetron (ZOFRAN) 4 MG tablet Take 1 tablet (4  mg total) by mouth every 6 (six) hours as needed for nausea or vomiting. 07/10/16   Julianne Rice, MD    Family History Family History  Problem Relation Age of Onset  . Hypertension    . CAD Neg Hx     Social History Social History  Substance Use Topics  . Smoking status: Current Every Day Smoker    Packs/day: 2.00  . Smokeless tobacco: Current User     Comment: Since age 4-9.   Marland Kitchen Alcohol use Yes     Comment: 6-10 beers every night     Allergies   Patient has no known allergies.   Review of Systems Review of Systems  Constitutional: Negative for appetite change.  HENT: Negative for congestion.   Respiratory: Negative for chest  tightness and shortness of breath.   Gastrointestinal: Negative for abdominal pain.  Genitourinary: Negative for flank pain and hematuria.  Musculoskeletal: Positive for back pain.  Skin: Negative for rash and wound.  Neurological: Negative for headaches.  Hematological: Negative for adenopathy.  Psychiatric/Behavioral: Negative for confusion.     Physical Exam Updated Vital Signs BP 154/85 (BP Location: Right Arm)   Pulse (!) 53   Temp 97.7 F (36.5 C) (Oral)   Resp 16   SpO2 95%   Physical Exam  Constitutional: He appears well-developed.  HENT:  Head: Normocephalic.  Neck: Neck supple.  Cardiovascular: Normal rate.   Pulmonary/Chest: Effort normal.  Abdominal: There is no tenderness.  Musculoskeletal: He exhibits tenderness.  Lumbar tenderness with some muscle spasm. No deformity. Some pain with straight leg raise bilaterally. Neurovascular intact bilateral feet. Overall rather benign exam.  Skin: Skin is warm.  Psychiatric: He has a normal mood and affect.     ED Treatments / Results  Labs (all labs ordered are listed, but only abnormal results are displayed) Labs Reviewed  URINALYSIS, ROUTINE W REFLEX MICROSCOPIC - Abnormal; Notable for the following:       Result Value   Leukocytes, UA TRACE (*)    All other components within normal limits    EKG  EKG Interpretation None       Radiology No results found.  Procedures Procedures (including critical care time)  Medications Ordered in ED Medications  methocarbamol (ROBAXIN) tablet 500 mg (500 mg Oral Given 08/28/16 0848)  ketorolac (TORADOL) 30 MG/ML injection 30 mg (30 mg Intramuscular Given 08/28/16 0848)     Initial Impression / Assessment and Plan / ED Course  I have reviewed the triage vital signs and the nursing notes.  Pertinent labs & imaging results that were available during my care of the patient were reviewed by me and considered in my medical decision making (see chart for details).       Patient with low back pain. No red flags. Feels better after Toradol and muscle relaxers. Will discharge home.  Final Clinical Impressions(s) / ED Diagnoses   Final diagnoses:  Acute midline low back pain with bilateral sciatica    New Prescriptions New Prescriptions   METHOCARBAMOL (ROBAXIN) 500 MG TABLET    Take 1 tablet (500 mg total) by mouth every 8 (eight) hours as needed for muscle spasms.     Davonna Belling, MD 08/28/16 1005

## 2016-08-28 NOTE — ED Notes (Signed)
Called for triage x 2 without answer

## 2016-08-28 NOTE — ED Notes (Signed)
Pt verbalizes understanding of discharge instructions. NAD. Ambulatory at discharge, wheeled to waiting room for family member to transport home.

## 2016-10-03 DIAGNOSIS — R1084 Generalized abdominal pain: Secondary | ICD-10-CM | POA: Insufficient documentation

## 2017-05-30 DIAGNOSIS — K8010 Calculus of gallbladder with chronic cholecystitis without obstruction: Secondary | ICD-10-CM | POA: Insufficient documentation

## 2017-08-02 DIAGNOSIS — J449 Chronic obstructive pulmonary disease, unspecified: Secondary | ICD-10-CM

## 2017-08-02 DIAGNOSIS — R079 Chest pain, unspecified: Secondary | ICD-10-CM

## 2017-08-03 DIAGNOSIS — R079 Chest pain, unspecified: Secondary | ICD-10-CM

## 2017-09-06 IMAGING — CR DG ABDOMEN ACUTE W/ 1V CHEST
4 series · 4 of 4 positions shown · non-contrast
Comparison: None.

CLINICAL DATA: Abdominal pain and burning sensation for but 3 weeks
with cough.

EXAM:
DG ABDOMEN ACUTE W/ 1V CHEST

[chest pa]
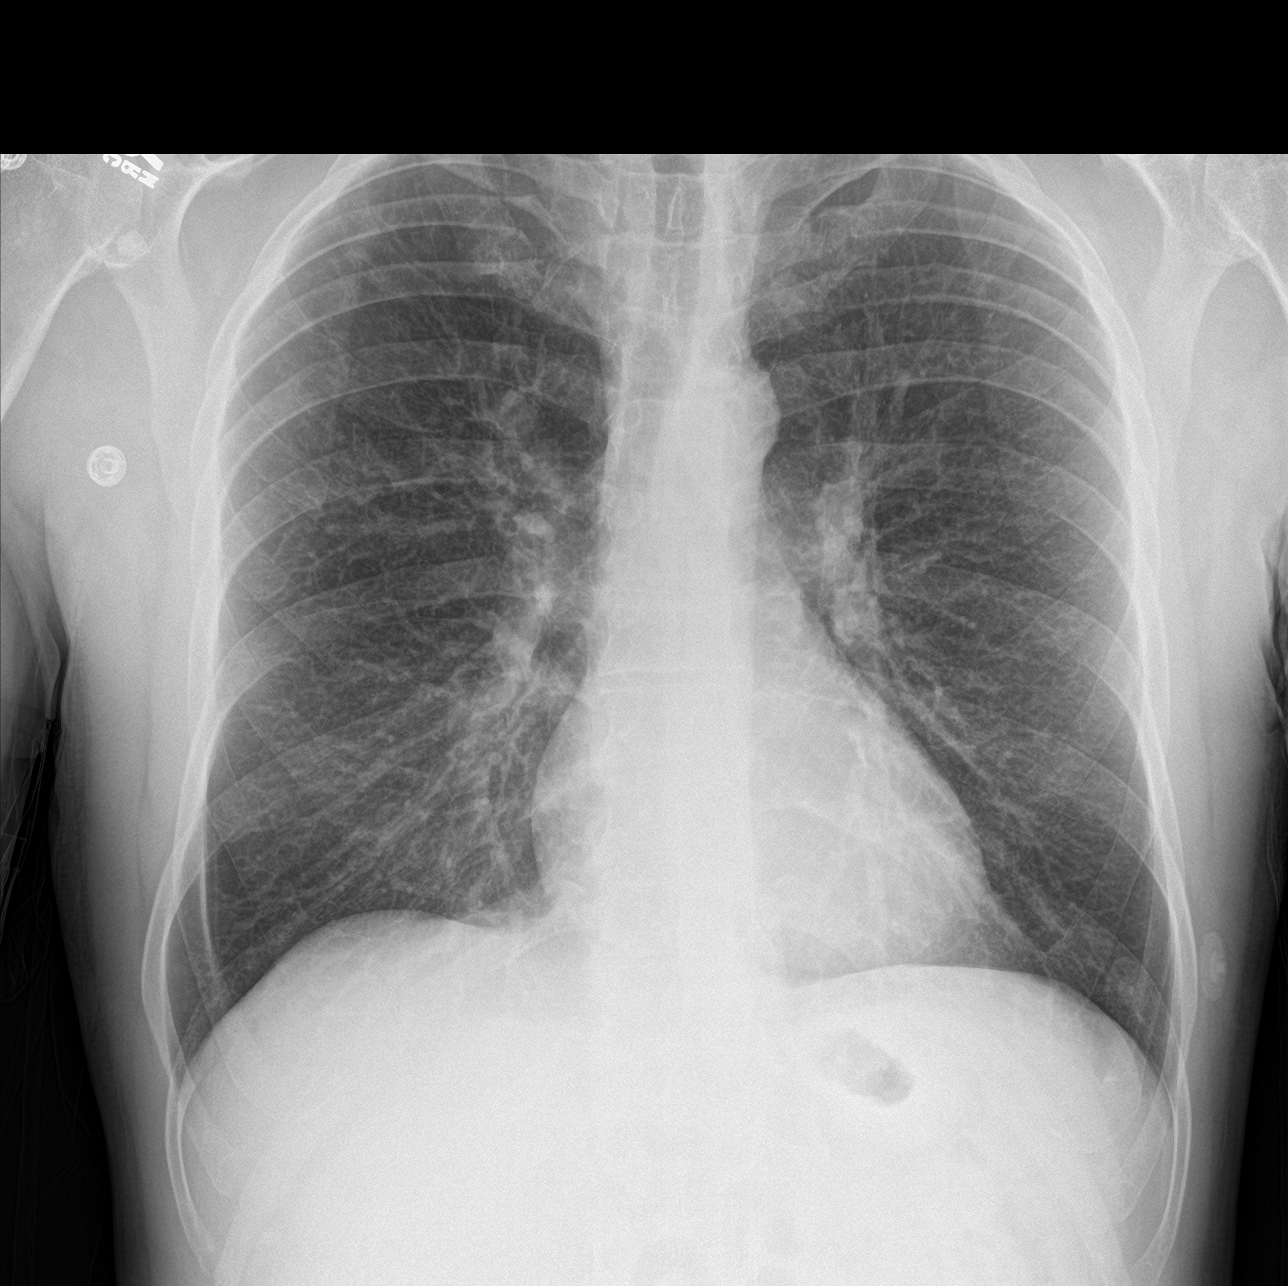

[abdomen erect]
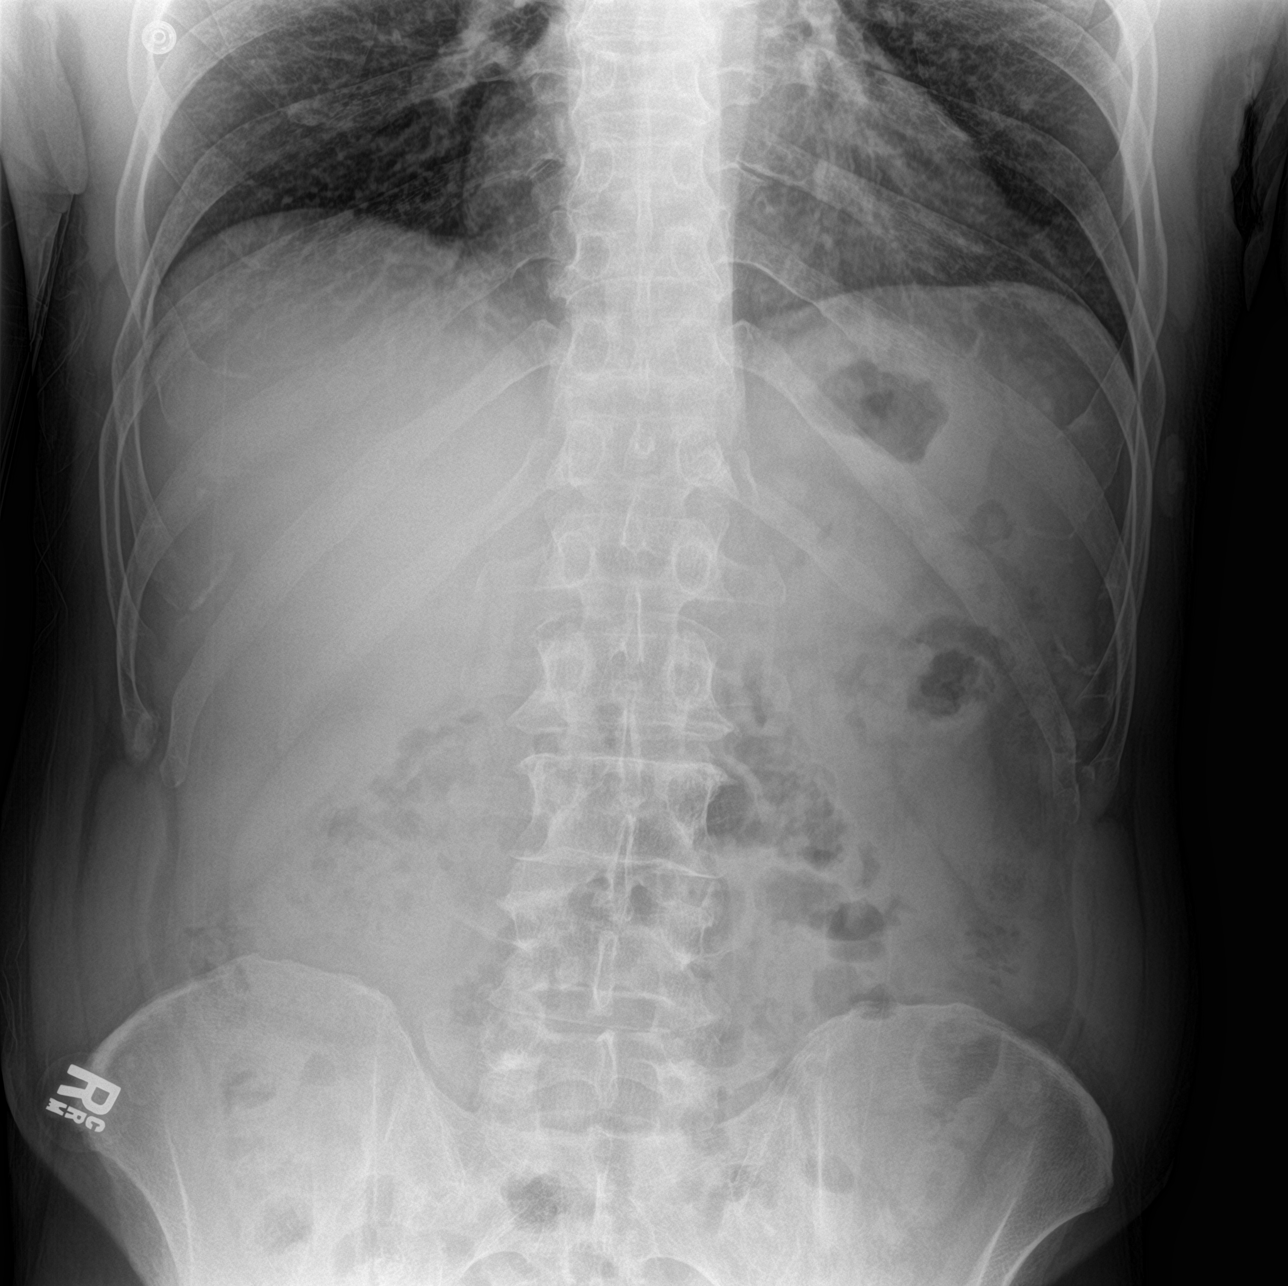

[abdomen supine (1 of 2)]
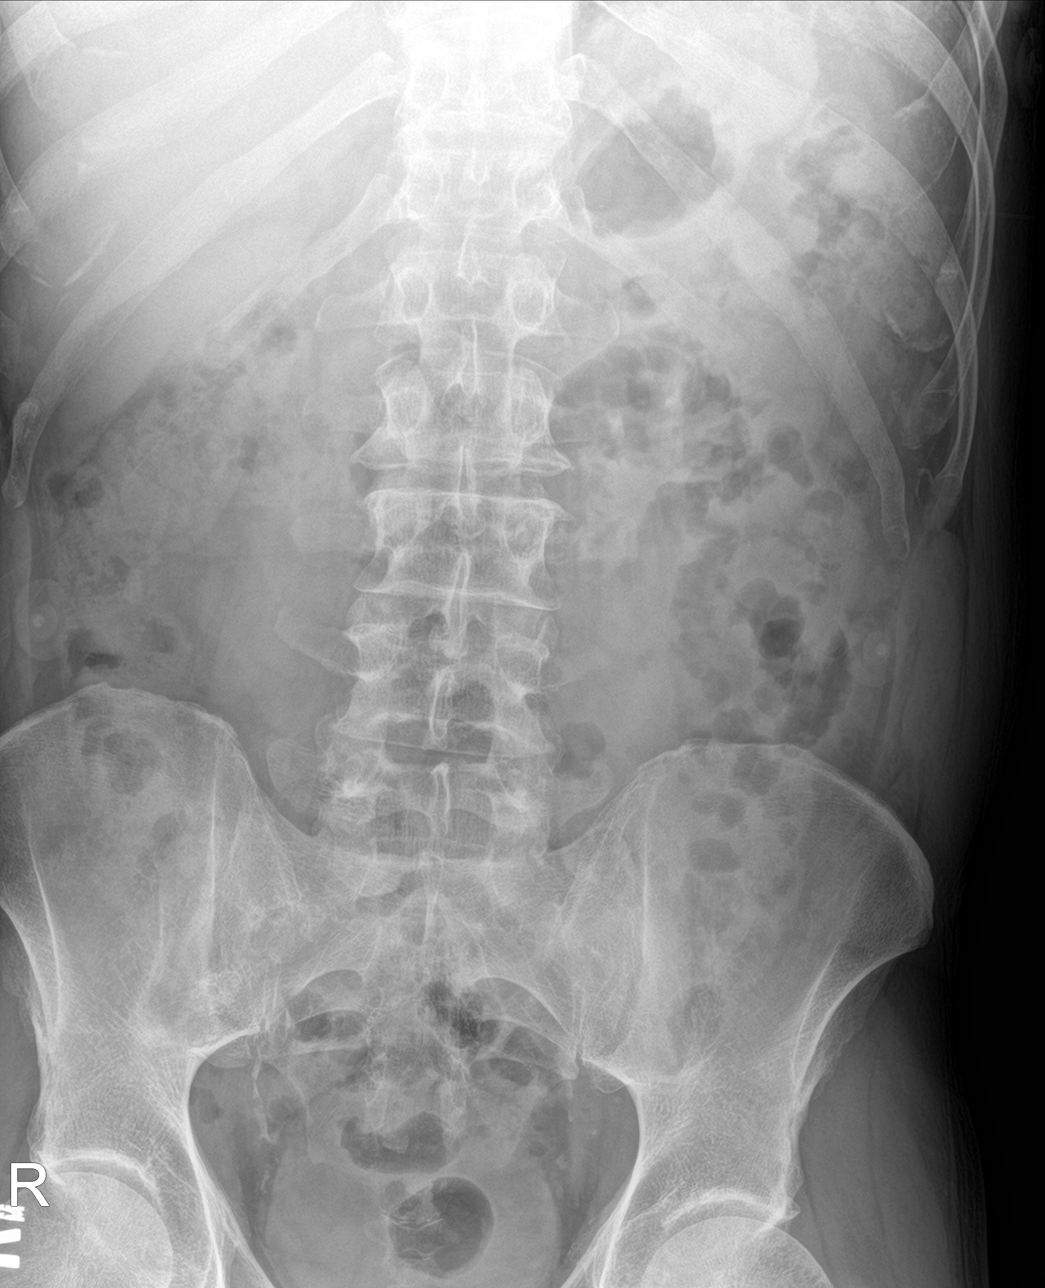

[abdomen supine (2 of 2)]
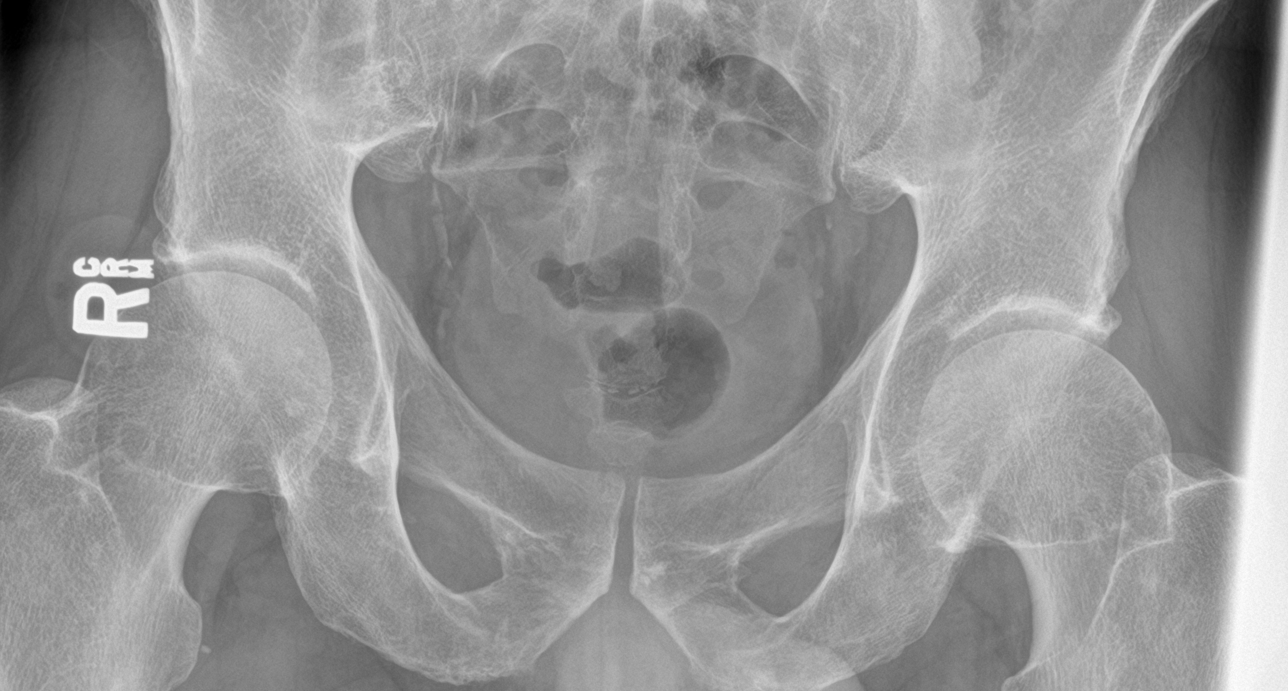

[4 of 4 positions shown; findings below may reference images not displayed]

FINDINGS: There is no evidence of dilated bowel loops or free intraperitoneal
air. No radiopaque calculi or other significant radiographic
abnormality is seen. Heart size and mediastinal contours are within
normal limits. Both lungs are clear.
IMPRESSION: Negative abdominal radiographs.  No acute cardiopulmonary disease.

## 2017-11-14 DIAGNOSIS — C3490 Malignant neoplasm of unspecified part of unspecified bronchus or lung: Secondary | ICD-10-CM

## 2017-11-14 HISTORY — DX: Malignant neoplasm of unspecified part of unspecified bronchus or lung: C34.90

## 2017-12-25 DIAGNOSIS — R918 Other nonspecific abnormal finding of lung field: Secondary | ICD-10-CM | POA: Diagnosis not present

## 2018-01-03 DIAGNOSIS — F17218 Nicotine dependence, cigarettes, with other nicotine-induced disorders: Secondary | ICD-10-CM

## 2018-01-03 DIAGNOSIS — R918 Other nonspecific abnormal finding of lung field: Secondary | ICD-10-CM

## 2018-01-03 DIAGNOSIS — C3401 Malignant neoplasm of right main bronchus: Secondary | ICD-10-CM

## 2018-01-03 DIAGNOSIS — Z808 Family history of malignant neoplasm of other organs or systems: Secondary | ICD-10-CM

## 2018-01-24 DIAGNOSIS — Z808 Family history of malignant neoplasm of other organs or systems: Secondary | ICD-10-CM

## 2018-01-24 DIAGNOSIS — C3401 Malignant neoplasm of right main bronchus: Secondary | ICD-10-CM

## 2018-01-24 DIAGNOSIS — B029 Zoster without complications: Secondary | ICD-10-CM

## 2018-02-11 DIAGNOSIS — C3401 Malignant neoplasm of right main bronchus: Secondary | ICD-10-CM

## 2018-02-25 DIAGNOSIS — C3401 Malignant neoplasm of right main bronchus: Secondary | ICD-10-CM

## 2018-04-17 DIAGNOSIS — Z9221 Personal history of antineoplastic chemotherapy: Secondary | ICD-10-CM | POA: Diagnosis not present

## 2018-04-17 DIAGNOSIS — Z923 Personal history of irradiation: Secondary | ICD-10-CM

## 2018-04-17 DIAGNOSIS — C3401 Malignant neoplasm of right main bronchus: Secondary | ICD-10-CM

## 2018-05-29 DIAGNOSIS — R05 Cough: Secondary | ICD-10-CM | POA: Diagnosis not present

## 2018-05-29 DIAGNOSIS — Z923 Personal history of irradiation: Secondary | ICD-10-CM

## 2018-05-29 DIAGNOSIS — C3401 Malignant neoplasm of right main bronchus: Secondary | ICD-10-CM

## 2018-05-29 DIAGNOSIS — F17210 Nicotine dependence, cigarettes, uncomplicated: Secondary | ICD-10-CM

## 2018-05-29 DIAGNOSIS — Z9221 Personal history of antineoplastic chemotherapy: Secondary | ICD-10-CM | POA: Diagnosis not present

## 2018-07-11 DIAGNOSIS — C3401 Malignant neoplasm of right main bronchus: Secondary | ICD-10-CM | POA: Diagnosis not present

## 2018-08-08 DIAGNOSIS — C3401 Malignant neoplasm of right main bronchus: Secondary | ICD-10-CM

## 2018-09-05 DIAGNOSIS — C3401 Malignant neoplasm of right main bronchus: Secondary | ICD-10-CM | POA: Diagnosis not present

## 2018-10-03 DIAGNOSIS — C3401 Malignant neoplasm of right main bronchus: Secondary | ICD-10-CM

## 2018-10-31 DIAGNOSIS — C3401 Malignant neoplasm of right main bronchus: Secondary | ICD-10-CM

## 2018-11-21 ENCOUNTER — Encounter: Payer: Self-pay | Admitting: Neurology

## 2018-11-28 DIAGNOSIS — C3401 Malignant neoplasm of right main bronchus: Secondary | ICD-10-CM

## 2018-12-26 DIAGNOSIS — C3401 Malignant neoplasm of right main bronchus: Secondary | ICD-10-CM | POA: Diagnosis not present

## 2018-12-30 ENCOUNTER — Ambulatory Visit: Payer: Medicaid Other | Admitting: Neurology

## 2019-01-06 ENCOUNTER — Encounter: Payer: Self-pay | Admitting: Neurology

## 2019-01-06 ENCOUNTER — Other Ambulatory Visit: Payer: Self-pay

## 2019-01-06 ENCOUNTER — Other Ambulatory Visit (INDEPENDENT_AMBULATORY_CARE_PROVIDER_SITE_OTHER): Payer: Medicaid Other

## 2019-01-06 ENCOUNTER — Ambulatory Visit (INDEPENDENT_AMBULATORY_CARE_PROVIDER_SITE_OTHER): Payer: Medicaid Other | Admitting: Neurology

## 2019-01-06 VITALS — BP 104/60 | HR 56 | Ht 74.0 in | Wt 229.0 lb

## 2019-01-06 DIAGNOSIS — G959 Disease of spinal cord, unspecified: Secondary | ICD-10-CM

## 2019-01-06 DIAGNOSIS — C3490 Malignant neoplasm of unspecified part of unspecified bronchus or lung: Secondary | ICD-10-CM

## 2019-01-06 DIAGNOSIS — F1010 Alcohol abuse, uncomplicated: Secondary | ICD-10-CM | POA: Diagnosis not present

## 2019-01-06 DIAGNOSIS — R202 Paresthesia of skin: Secondary | ICD-10-CM

## 2019-01-06 DIAGNOSIS — Z720 Tobacco use: Secondary | ICD-10-CM

## 2019-01-06 NOTE — Progress Notes (Signed)
[facility] Clinic Note - Initial Visit   Date: 01/06/19  Kevin Ritter MRN: [id number] DOB: 1966-01-18   Dear Dr. Bobby Rumpf:  Thank you for your kind referral of Kevin Ritter for consultation of bilateral leg pain. Although his history is well known to you, please allow [place] to reiterate it for the purpose of our medical record. The patient was accompanied to the clinic by self.    History of Present Illness: Kevin Ritter is a 53 y.o. right-handed male with stage IIIb squamous cell lung cancer, tobacco abuse, CAD, and alcohol abuse presenting for evaluation of bilateral leg pain.   Starting early 2020, he began having burning and stabbing pain in the feet.  Numbness/tingling involves the feet and also involves the lower legs.  Pain is worse when he is trying to walk and it feels like needles are stabbing in the soles of the feet.  His balance is fair and walks unassisted.  He has near-falls and tends to trip, but has not had any falls.  He has a walker, but tends not to use it.  He has some weakness in the feet.  He complains of leg cramps.  He has chronic low back pain and recently establish care with pain management.  He takes Norco 10 mg every 6 hours and gabapentin 300 mg at bedtime.  He does not get any relief with his feet discomfort.  He denies any bowel or bladder dysfunction.  He was diagnosed with squamous cell lung cancer was diagnosed in May 2019.  He underwent chemotherapy with carboplatin/paclitaxel during the summer 2019, and on maintenance immunotherapy with durvalumab.  He does not recall having numbness or tingling while on chemotherapy.  He continues to smoke 3/4 PPD, and previously was smoking 3-4 PPD for about 20 years.  He drinks 3-6 beers nightly for the past 40 years.  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   Out-side paper records, electronic medical record, and images have been reviewed  where available and summarized as:  MRI brain 2019: 6 mm enhancing meningioma along the petrous ridge on the left is stable.  Negative for metastatic disease.  Past Medical History:  Diagnosis Date  . Arthritis   . COPD (chronic obstructive pulmonary disease) ([place])   . ETOH abuse   . Financial difficulty   . Hypertension   . Lung cancer ([place]) 11/2017  . Tobacco abuse     Past Surgical History:  Procedure Laterality Date  . LEFT HEART CATHETERIZATION WITH CORONARY ANGIOGRAM N/A 08/18/2014   Procedure: LEFT HEART CATHETERIZATION WITH CORONARY ANGIOGRAM;  Surgeon: Burnell Blanks, MD;  Location: [facility] CATH LAB;  Service: Cardiovascular;  Laterality: N/A;  . SHOULDER ARTHROSCOPY     left     Medications:  Outpatient Encounter Medications as of 01/06/2019  Medication Sig  . albuterol (PROVENTIL HFA;VENTOLIN HFA) 108 (90 Base) MCG/ACT inhaler Inhale 1-2 puffs into the lungs every 4 (four) hours as needed for wheezing or shortness of breath.  Marland Kitchen atorvastatin (LIPITOR) 20 MG tablet Take 1 tablet (20 mg total) by mouth daily at 6 PM.  . benzonatate (TESSALON) 100 MG capsule Take 1 capsule (100 mg total) by mouth 3 (three) times daily as needed for cough.  . esomeprazole (NEXIUM) 20 MG capsule Take 20 mg by mouth daily at 12 noon.  . gabapentin (NEURONTIN) 300 MG capsule Take 300 mg by mouth daily.  Marland Kitchen HYDROcodone-acetaminophen (NORCO) 10-325 MG tablet Take 1  tablet by mouth every 6 (six) hours as needed.  Marland Kitchen levothyroxine (SYNTHROID) 100 MCG tablet Take 100 mcg by mouth daily before breakfast.  . metoprolol tartrate (LOPRESSOR) 25 MG tablet Take 0.5 tablets (12.5 mg total) by mouth 2 (two) times daily.  . nitroGLYCERIN (NITROSTAT) 0.4 MG SL tablet Place 1 tablet (0.4 mg total) under the tongue every 5 (five) minutes x 3 doses as needed for chest pain.  . [DISCONTINUED] clopidogrel (PLAVIX) 75 MG tablet Take 1 tablet (75 mg total) by mouth daily.  . [DISCONTINUED] lansoprazole (PREVACID) 30  MG capsule Take 1 capsule (30 mg total) by mouth daily at 12 noon.  . [DISCONTINUED] methocarbamol (ROBAXIN) 500 MG tablet Take 1 tablet (500 mg total) by mouth every 8 (eight) hours as needed for muscle spasms.  . [DISCONTINUED] aspirin EC 81 MG EC tablet Take 1 tablet (81 mg total) by mouth daily. (Patient not taking: Reported on 07/10/2016)  . [DISCONTINUED] isosorbide mononitrate (IMDUR) 30 MG 24 hr tablet Take 1 tablet (30 mg total) by mouth daily. (Patient not taking: Reported on 07/10/2016)  . [DISCONTINUED] ondansetron (ZOFRAN) 4 MG tablet Take 1 tablet (4 mg total) by mouth every 6 (six) hours as needed for nausea or vomiting.   No facility-administered encounter medications on file as of 01/06/2019.     Allergies: No Known Allergies  Family History: Family History  Problem Relation Age of Onset  . Hypertension Other   . CAD Neg Hx     Social History: Social History   Tobacco Use  . Smoking status: Current Every Day Smoker    Packs/day: 2.00  . Smokeless tobacco: Current User  . Tobacco comment: Since age [age].   Substance Use Topics  . Alcohol use: Yes    Comment: 6-10 beers every night  . Drug use: Yes    Types: Marijuana    Comment: Last use on Sunday 08/16/14   Social History   Social History Narrative   Lives with niece and her boyfriend with their two kids. Also pts GF.      Has front porch steps      Highest level of edu- 9th grade      Right handed      Applying for disability    Review of Systems:  CONSTITUTIONAL: No fevers, chills, night sweats, or weight loss.   EYES: No visual changes or eye pain ENT: No hearing changes.  No history of nose bleeds.   RESPIRATORY: No cough, wheezing and shortness of breath.   CARDIOVASCULAR: Negative for chest pain, and palpitations.   GI: Negative for abdominal discomfort, blood in stools or black stools.  No recent change in bowel habits.   GU:  No history of incontinence.   MUSCLOSKELETAL: +history of joint  pain +swelling.  No myalgias.   SKIN: Negative for lesions, rash, and itching.   HEMATOLOGY/ONCOLOGY: Negative for prolonged bleeding, bruising easily, and swollen nodes.  +history of cancer.   ENDOCRINE: Negative for cold or heat intolerance, polydipsia or goiter.   PSYCH:  No depression or anxiety symptoms.   NEURO: As Above.   Vital Signs:  BP 104/60   Pulse (!) 56   Ht 6\' 2"  (1.88 m)   Wt 229 lb (103.9 kg)   SpO2 95%   BMI 29.40 kg/m    General Medical Exam:. General: Slightly disheveled appearing, poorly groomed, comfortable.   Eyes/ENT: see cranial nerve examination.  Edentulous. Neck:   No carotid bruits. Respiratory:  Clear to auscultation, good  air entry bilaterally.   Cardiac:  Regular rate and rhythm, no murmur.   Extremities: Thick, yellow, elongated, and discolored toenails bilaterally.  There is 1+ pitting edema of the feet and lower legs.  Reduced hair growth distally.    Skin:  No rashes or lesions.  Neurological Exam: MENTAL STATUS including orientation to time, place, person, recent and remote memory, attention span and concentration, language, and fund of knowledge is normal.  Speech is not dysarthric.  CRANIAL NERVES: II:  No visual field defects.  Unremarkable fundi.   III-IV-VI: Pupils equal round and reactive to light.  Normal conjugate, extra-ocular eye movements in all directions of gaze.  No nystagmus.  No ptosis.   V:  Normal facial sensation.    VII:  Normal facial symmetry and movements.   VIII:  Normal hearing and vestibular function.   IX-X:  Normal palatal movement.   XI:  Normal shoulder shrug and head rotation.   XII:  Normal tongue strength and range of motion, no deviation or fasciculation.  MOTOR:  Mild muscle loss in the lower legs.  No fasciculations or abnormal movements.  No pronator drift.   Upper Extremity:  Right  Left  Deltoid  5/5   5/5   Biceps  5/5   5/5   Triceps  5/5   5/5   Infraspinatus 5/5  5/5  Medial pectoralis 5/5   5/5  Wrist extensors  5/5   5/5   Wrist flexors  5/5   5/5   Finger extensors  5/5   5/5   Finger flexors  5/5   5/5   Dorsal interossei  5/5   5/5   Abductor pollicis  5/5   5/5   Tone (Ashworth scale)  0  0   Lower Extremity:  Right  Left  Hip flexors  5/5   5/5   Hip extensors  5/5   5/5   Adductor 5/5  5/5  Abductor 5/5  5/5  Knee flexors  5/5   5/5   Knee extensors  5/5   5/5   Dorsiflexors  5-/5   5-/5   Plantarflexors  5-/5   5-/5   Toe extensors  5-/5   5-/5   Toe flexors  5-/5   5-/5   Tone (Ashworth scale)  0  0   MSRs:  Right        Left                  brachioradialis 2+  2+  biceps 2+  2+  triceps 2+  2+  patellar 3+  3+  ankle jerk 1+  1+  Hoffman no  no  plantar response up  up   SENSORY:  Normal and symmetric perception of light touch, pinprick, vibration, and proprioception.    COORDINATION/GAIT: Normal finger-to- nose-finger and heel-to-shin.  Intact rapid alternating movements bilaterally.  Gait is antalgic, unassisted  IMPRESSION: 1.  Bilateral leg paresthesias and pain with exam findings of myelopathy are concerning for lumbar canal pathology, such as spinal stenosis.  With his history of lung cancer, I will MRI lumbar spine with and without contrast to look for metastasis. 2.  Overlapping neuropathy affecting the feet, most likely due to long history of alcohol use and exacerbated by chemotherapy.   Counseled patient on trying to reduce his alcohol consumption, if not abstain altogether to prevent worsening neuropathy. 3.  Tobacco abuse complicated by lung cancer, CAD, and COPD.  Long history of tobacco use  smoking up to 3 to 4 packs/day and recently down to 0.75 PPD.  Patient was informed of the dangers of tobacco abuse including stroke, cancer, and MI, as well as benefits of tobacco cessation.  Patient has tried to quit, but not willing to do quit.  Approximately 5 mins were spent counseling patient cessation techniques. We discussed various methods to  help quit smoking, including deciding on a date to quit, joining a support group, pharmacological agents- nicotine gum/patch/lozenges, chantix.  I will reassess his progress at the next follow-up visit   PLAN/RECOMMENDATIONS:  1.  MRI lumbar spine wwo contrast 2.  Check vitamin B12, vitamin B1, folate,  [place] 3.  He is established with pain management and recommend medication management for pain through their office  4.  He may benefit from seeing podiatry for his onychomycosis, will defer to PCP to refer locally  Further recommendations pending results.  Thank you for allowing me to participate in patient's care.  If I can answer any additional questions, I would be pleased to do so.    Sincerely,    Lezlie Ritchey K. Posey Pronto, DO

## 2019-01-06 NOTE — Patient Instructions (Signed)
Check MRI lumbar spine wwo contrast  Check labs  Follow-up with pain management for pain recommendations  Consider seeing a podiatrist for your toe nails.  You may want to discuss this with your primary care doctor

## 2019-01-07 LAB — B12 AND FOLATE PANEL
Folate: 8.5 ng/mL
Vitamin B-12: 355 pg/mL (ref 200–1100)

## 2019-01-08 ENCOUNTER — Telehealth: Payer: Self-pay | Admitting: Neurology

## 2019-01-08 NOTE — Telephone Encounter (Signed)
Kim from [facility] called about a referral they received and patient will need to have medicaid authorization done and lab work orders sent before they can complete the appt. Thanks!

## 2019-01-09 NOTE — Telephone Encounter (Signed)
Tammy called now from [facility] about authorization and left msg with after hours services. Thanks!

## 2019-01-09 NOTE — Telephone Encounter (Signed)
Spoke with Melissa at [facility]. She states that pt needs approval for MRI by 4pm today or will have to be cancelled  Site is MRI of [place] NPI [id number] Has to go through [facility]. Pt had MCD  Informed Esmerelda in precerts of this

## 2019-01-10 ENCOUNTER — Telehealth: Payer: Self-pay | Admitting: Neurology

## 2019-01-10 LAB — VITAMIN B1: Vitamin B1 (Thiamine): 12 nmol/L (ref 8–30)

## 2019-01-10 LAB — COPPER, SERUM: Copper: 105 ug/dL (ref 70–175)

## 2019-01-10 NOTE — Telephone Encounter (Signed)
MRI Auth denied by insurance perTami at [facility]. please call Tami @ [phone].

## 2019-01-10 NOTE — Telephone Encounter (Signed)
Spoke with Tami at [facility]. We have rescheduled pt to 01/22/19 at 12:45 and he needs to arrive at 12:30.  Pt has been made aware of the appt and that he MRI is still waiting authorization from his insurance.  Arnette Norris is precerts is working on this. Will contact her on Monday on status.

## 2019-01-16 ENCOUNTER — Telehealth: Payer: Self-pay | Admitting: Neurology

## 2019-01-16 NOTE — Telephone Encounter (Signed)
Patient said he was returning a call about his MRI paperwork.  Please call him back at [phone]. Thanks!

## 2019-01-20 NOTE — Telephone Encounter (Signed)
Patient is following up on MRI precert.  He received the information about the denial but is still scheduled for his MRI.  Please advise.

## 2019-01-21 NOTE — Telephone Encounter (Signed)
[facility] is calling in now on denial for MRI and wanting to know if Dr. Posey Pronto would like to do Peer to Peer or if they need to cancel appt. Please call them back at [phone]. Thanks!

## 2019-01-21 NOTE — Telephone Encounter (Signed)
Based on above note/guideline - patient should meet criteria for MRI lumbar based on neurological compromise, as I am not looking for bone metastasis.  Imaging ordered to evaluate for central canal stenosis.  When is his MRI scheduled?  Please provide information for peer-to-peer.

## 2019-01-21 NOTE — Telephone Encounter (Signed)
Dr Conley Canal from [facility] will be calling at 4pm to do a peer to peer for MRI.  Case # [id number].

## 2019-01-21 NOTE — Telephone Encounter (Signed)
Dr Posey Pronto. Based on [employer] Imaging Guidelines Section: ONC 31.5 Bone (including Vertebral) Metastases, we cannot approve this request. Your records show that you may have cancer that has spread to your bone(s) from another part of your body. The reason this request cannot be approved is because: 1. Guidelines support CT or MRI in the evaluation of suspected bone metastatic disease for any of the following. -Bone scan is not feasible or readily available. -Continued suspicion despite inconclusive or negative bone scan or other imaging modalities. -New onset of back pain in a patient with Stage four cancer. -There is neurological compromise. -There is a soft tissue component suggested on other imaging modalities or physical exam. -To differentiate neoplastic disease from Paget's disease of bone. The clinical information provided does not describe at least one of these and, therefore, the request is not indicated at this time. Do you want to do a peer to peer [phone]?  I have left a message with Oval Linsey to call.

## 2019-01-21 NOTE — Telephone Encounter (Signed)
MRI lumbar spine approved #[id number], valid for 180-days from today.

## 2019-01-22 ENCOUNTER — Telehealth: Payer: Self-pay | Admitting: Neurology

## 2019-01-22 NOTE — Telephone Encounter (Signed)
Tried to contact patient but call could not be completed.  Dr Posey Pronto did a Peer to Peer for MRI and was able to get it approved.  Kevin Ritter is aware and should be contacting patient to schedule.

## 2019-01-22 NOTE — Telephone Encounter (Signed)
Patient is going to contact [facility] to schedule MRI.

## 2019-01-22 NOTE — Telephone Encounter (Signed)
Patient said that his insurance wont approve the MRI and wanted to know if a CT Scan could be ordered instead. Please call him back. Thanks!

## 2019-01-23 DIAGNOSIS — C3401 Malignant neoplasm of right main bronchus: Secondary | ICD-10-CM

## 2019-01-29 ENCOUNTER — Telehealth: Payer: Self-pay | Admitting: Neurology

## 2019-01-29 NOTE — Telephone Encounter (Signed)
MRI Lumbar spine 01/27/2019 performed at [facility]:   No significant lumbar spine disc protrusion, foraminal stenosis, or central canal stenosis. No aggressive osseous lesion to suggest metastatic disease.   Please inform patient that his MRI lumbar spine looks very good - no sign of nerve impingement or boney metastasis.  Therefore, pain in the feet is due to neuropathy.    Continue to follow-up with pain management for painful symptoms.  If his balance is causing problems, he can be referred to PT for balance training.   Return to clinic, if symptoms get worse.

## 2019-01-29 NOTE — Telephone Encounter (Signed)
Left message for patient to call office to inform him of MRI results and recommendations.

## 2019-02-04 ENCOUNTER — Telehealth: Payer: Self-pay | Admitting: Neurology

## 2019-02-04 NOTE — Telephone Encounter (Signed)
Left message for patient to call office to advise on MRI results and recommendations.

## 2019-02-04 NOTE — Telephone Encounter (Signed)
Patient was calling in needing MRI results. Thanks!

## 2019-02-04 NOTE — Telephone Encounter (Signed)
Left message for patient to call back to advise on MRI results.

## 2019-02-04 NOTE — Telephone Encounter (Signed)
Spoke with patient.  Informed him of MRI results and recommendations.  He says he tried to get in with pain management but they will not see him due to him using marijuana.  Advised patient he can seek other pain managment facilities.

## 2019-02-20 DIAGNOSIS — C3401 Malignant neoplasm of right main bronchus: Secondary | ICD-10-CM | POA: Diagnosis not present

## 2019-03-10 DIAGNOSIS — M79671 Pain in right foot: Secondary | ICD-10-CM | POA: Insufficient documentation

## 2019-03-10 DIAGNOSIS — M722 Plantar fascial fibromatosis: Secondary | ICD-10-CM | POA: Insufficient documentation

## 2019-03-10 DIAGNOSIS — M24571 Contracture, right ankle: Secondary | ICD-10-CM | POA: Insufficient documentation

## 2019-04-03 DIAGNOSIS — C3401 Malignant neoplasm of right main bronchus: Secondary | ICD-10-CM

## 2019-05-13 DIAGNOSIS — C3401 Malignant neoplasm of right main bronchus: Secondary | ICD-10-CM

## 2019-06-09 ENCOUNTER — Encounter: Payer: Self-pay | Admitting: *Deleted

## 2019-06-10 ENCOUNTER — Encounter: Payer: Self-pay | Admitting: Diagnostic Neuroimaging

## 2019-06-10 ENCOUNTER — Ambulatory Visit: Payer: Medicaid Other | Admitting: Diagnostic Neuroimaging

## 2019-06-10 ENCOUNTER — Other Ambulatory Visit: Payer: Self-pay

## 2019-06-10 VITALS — BP 142/85 | HR 45 | Temp 97.7°F | Ht 74.0 in | Wt 213.0 lb

## 2019-06-10 DIAGNOSIS — G43109 Migraine with aura, not intractable, without status migrainosus: Secondary | ICD-10-CM

## 2019-06-10 MED ORDER — TOPIRAMATE 50 MG PO TABS: 50.0000 mg | ORAL_TABLET | Freq: Two times a day (BID) | ORAL | 12 refills | Status: AC

## 2019-06-10 MED ORDER — RIZATRIPTAN BENZOATE 10 MG PO TBDP: 10.0000 mg | ORAL_TABLET | ORAL | 11 refills | Status: AC | PRN

## 2019-06-10 NOTE — Progress Notes (Signed)
[facility]  PATIENT: Kevin Riddles Sr. DOB: [birth year]  REFERRING CLINICIAN: S Martinique HISTORY FROM: patient  REASON FOR VISIT: new consult    HISTORICAL  CHIEF COMPLAINT:  Chief Complaint  Patient presents with  . Headache    rm 7, New Pt, fiancee-Tracy "headaches, MRI showed tumor 6 cm- stable"     HISTORY OF PRESENT ILLNESS:   53 year old male here for evaluation of headaches.  Patient has had headaches for at least 1 to 2 years (sometime in early 2019) consisting of right-sided severe throbbing sensation lasting hours at a time.  Headaches can be associated with blurred vision, nausea, sensitivity to light. Patient continues to have headaches almost on daily basis.  He tried nasal sumatriptan by PCP which did not help.  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  Headaches have not changed during or after lung cancer treatment.  Patient still using tobacco, alcohol and cannabis on daily basis.   REVIEW OF SYSTEMS: Full 14 system review of systems performed and negative with exception of: As per HPI.  ALLERGIES: No Known Allergies  HOME MEDICATIONS: Outpatient Medications Prior to Visit  Medication Sig Dispense Refill  . albuterol (PROVENTIL HFA;VENTOLIN HFA) 108 (90 Base) MCG/ACT inhaler Inhale 1-2 puffs into the lungs every 4 (four) hours as needed for wheezing or shortness of breath. 1 Inhaler 0  . atorvastatin (LIPITOR) 20 MG tablet Take 1 tablet (20 mg total) by mouth daily at 6 PM. 30 tablet 5  . benzonatate (TESSALON) 100 MG capsule Take 1 capsule (100 mg total) by mouth 3 (three) times daily as needed for cough. 21 capsule 0  . esomeprazole (NEXIUM) 20 MG capsule Take 20 mg by mouth daily at 12 noon.    . gabapentin (NEURONTIN) 300 MG capsule Take 300 mg by mouth daily.    Marland Kitchen HYDROcodone-acetaminophen (NORCO) 10-325 MG tablet Take 1 tablet by mouth every 6 (six) hours as needed.     Marland Kitchen levothyroxine (SYNTHROID) 100 MCG tablet Take 100 mcg by mouth daily before breakfast.    . metoprolol tartrate (LOPRESSOR) 25 MG tablet Take 0.5 tablets (12.5 mg total) by mouth 2 (two) times daily. 60 tablet 5  . nitroGLYCERIN (NITROSTAT) 0.4 MG SL tablet Place 1 tablet (0.4 mg total) under the tongue every 5 (five) minutes x 3 doses as needed for chest pain. 25 tablet 12   No facility-administered medications prior to visit.     PAST MEDICAL HISTORY: Past Medical History:  Diagnosis Date  . Arthritis   . COPD (chronic obstructive pulmonary disease) ([place])   . ETOH abuse   . Financial difficulty   . Headache   . Hypertension   . Lung cancer ([place]) 11/2017   s/p chemo, radiation  . Meningioma ([place])   . Tobacco abuse     PAST SURGICAL HISTORY: Past Surgical History:  Procedure Laterality Date  . LEFT HEART CATHETERIZATION WITH CORONARY ANGIOGRAM N/A 08/18/2014   Procedure: LEFT HEART CATHETERIZATION WITH CORONARY ANGIOGRAM;  Surgeon: Burnell Blanks, MD;  Location: [facility] CATH LAB;  Service: Cardiovascular;  Laterality: N/A;  . SHOULDER ARTHROSCOPY     left    FAMILY HISTORY: Family History  Problem Relation Age of Onset  . Hypertension Other   . Thyroid cancer Father   . Other Son        Damien Fusi  . CAD Neg Hx     SOCIAL HISTORY: Social History   Socioeconomic History  .  Marital status: Single    Spouse name: Not on file  . Number of children: 2  . Years of education: 9  . Highest education level: Not on file  Occupational History    Comment: Architect  Social Needs  . Financial resource strain: Not on file  . Food insecurity    Worry: Not on file    Inability: Not on file  . Transportation needs    Medical: Not on file    Non-medical: Not on file  Tobacco Use  . Smoking status: Current Every Day Smoker    Packs/day: 3.00  . Smokeless tobacco: Current User  . Tobacco comment: Since age [age]. 06/09/19 Chantix, smoking 1/2-1 PPD  Substance and  Sexual Activity  . Alcohol use: Yes    Comment: 06/10/19 "cut down a lot", 6-10 beers every night x 40 year  . Drug use: Yes    Types: Marijuana    Comment: 06/10/19 "every day"  . Sexual activity: Not on file  Lifestyle  . Physical activity    Days per week: Not on file    Minutes per session: Not on file  . Stress: Not on file  Relationships  . Social Herbalist on phone: Not on file    Gets together: Not on file    Attends religious service: Not on file    Active member of club or organization: Not on file    Attends meetings of clubs or organizations: Not on file    Relationship status: Not on file  . Intimate partner violence    Fear of current or ex partner: Not on file    Emotionally abused: Not on file    Physically abused: Not on file    Forced sexual activity: Not on file  Other Topics Concern  . Not on file  Social History Narrative   Lives with niece and her boyfriend with their two kids. Also pts GF.      Has front porch steps      Highest level of edu- 9th grade      Right handed      Applying for disability     PHYSICAL EXAM  GENERAL EXAM/CONSTITUTIONAL: Vitals:  Vitals:   06/10/19 1304  BP: (!) 142/85  Pulse: (!) 45  Temp: 97.7 F (36.5 C)  Weight: 213 lb (96.6 kg)  Height: 6\' 2"  (1.88 m)     Body mass index is 27.35 kg/m. Wt Readings from Last 3 Encounters:  06/10/19 213 lb (96.6 kg)  01/06/19 229 lb (103.9 kg)  08/17/14 176 lb 5.9 oz (80 kg)     Patient is in no distress; well developed, nourished and groomed; neck is supple  CARDIOVASCULAR:  Examination of carotid arteries is normal; no carotid bruits  Regular rate and rhythm, no murmurs  Examination of peripheral vascular system by observation and palpation is normal  EYES:  Ophthalmoscopic exam of optic discs and posterior segments is normal; no papilledema or hemorrhages  No exam data present  MUSCULOSKELETAL:  Gait, strength, tone, movements noted in  Neurologic exam below  NEUROLOGIC: MENTAL STATUS:  No flowsheet data found.  awake, alert, oriented to person, place and time  recent and remote memory intact  normal attention and concentration  language fluent, comprehension intact, naming intact  fund of knowledge appropriate  CRANIAL NERVE:   2nd - no papilledema on fundoscopic exam  2nd, 3rd, 4th, 6th - pupils equal and reactive to light, visual fields full to  confrontation, extraocular muscles intact, no nystagmus  5th - facial sensation symmetric  7th - facial strength symmetric  8th - hearing intact  9th - palate elevates symmetrically, uvula midline  11th - shoulder shrug symmetric  12th - tongue protrusion midline  MOTOR:   normal bulk and tone, full strength in the BUE, BLE  SENSORY:   normal and symmetric to light touch, temperature, vibration  COORDINATION:   finger-nose-finger, fine finger movements SLOW  REFLEXES:   deep tendon reflexes TRACE and symmetric  GAIT/STATION:   narrow based gait     DIAGNOSTIC DATA (LABS, IMAGING, TESTING) - I reviewed patient records, labs, notes, testing and imaging myself where available.  Lab Results  Component Value Date   WBC 7.2 07/10/2016   HGB 17.9 (H) 07/10/2016   HCT 49.5 07/10/2016   MCV 95.6 07/10/2016   PLT 248 07/10/2016      Component Value Date/Time   NA 135 07/10/2016 1122   K 4.5 07/10/2016 1122   CL 104 07/10/2016 1122   CO2 24 07/10/2016 1122   GLUCOSE 101 (H) 07/10/2016 1122   BUN 8 07/10/2016 1122   CREATININE 1.15 07/10/2016 1122   CALCIUM 9.0 07/10/2016 1122   PROT 6.7 07/10/2016 1122   ALBUMIN 4.0 07/10/2016 1122   AST 28 07/10/2016 1122   ALT 24 07/10/2016 1122   ALKPHOS 82 07/10/2016 1122   BILITOT 0.5 07/10/2016 1122   GFRNONAA >60 07/10/2016 1122   GFRAA >60 07/10/2016 1122   Lab Results  Component Value Date   CHOL 194 08/18/2014   HDL 64 08/18/2014   LDLCALC 108 (H) 08/18/2014   TRIG 110 08/18/2014    CHOLHDL 3.0 08/18/2014   Lab Results  Component Value Date   HGBA1C 5.4 08/17/2014   Lab Results  Component Value Date   VITAMINB12 355 01/06/2019   Lab Results  Component Value Date   TSH 4.743 (H) 08/17/2014     05/20/19 MRI brain (with and without) [I reviewed images myself and agree with interpretation. 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  Dx:  1. Migraine with aura and without status migrainosus, not intractable     PLAN:  HEADACHES (MIGRAINE WITH AURA) - MIGRAINE PREVENTION --> start topiramate 50mg  at bedtime; after 1 week increase to twice a day; drink plenty of water  - MIGRAINE RESCUE --> rizatriptan 10mg  as needed for breakthrough headache; may repeat x 1 after 2 hours; max 2 tabs per day or 8 per month  Meds ordered this encounter  Medications  . topiramate (TOPAMAX) 50 MG tablet    Sig: Take 1 tablet (50 mg total) by mouth 2 (two) times daily.    Dispense:  60 tablet    Refill:  12  . rizatriptan (MAXALT-MLT) 10 MG disintegrating tablet    Sig: Take 1 tablet (10 mg total) by mouth as needed for migraine. May repeat in 2 hours if needed    Dispense:  9 tablet    Refill:  11   Return in about 6 months (around 12/08/2019) for with NP (Amy Lomax).    Penni Bombard, MD 66/44/0347, 4:25 PM Certified in Neurology, Neurophysiology and Neuroimaging  [facility] [phone]  [address] [place], [place] [address] [phone]

## 2019-06-10 NOTE — Patient Instructions (Signed)
HEADACHES (MIGRAINE WITH AURA) - MIGRAINE PREVENTION --> start topiramate 50mg  at bedtime; after 1 week increase to twice a day; drink plenty of water  - MIGRAINE RESCUE --> rizatriptan 10mg  as needed for breakthrough headache; may repeat x 1 after 2 hours; max 2 tabs per day or 8 per month

## 2019-06-23 ENCOUNTER — Ambulatory Visit (INDEPENDENT_AMBULATORY_CARE_PROVIDER_SITE_OTHER): Payer: Medicaid Other | Admitting: Cardiology

## 2019-06-23 ENCOUNTER — Other Ambulatory Visit: Payer: Self-pay

## 2019-06-23 ENCOUNTER — Encounter: Payer: Self-pay | Admitting: Cardiology

## 2019-06-23 VITALS — BP 130/70 | HR 86 | Ht 74.0 in | Wt 212.6 lb

## 2019-06-23 DIAGNOSIS — F17210 Nicotine dependence, cigarettes, uncomplicated: Secondary | ICD-10-CM | POA: Diagnosis not present

## 2019-06-23 DIAGNOSIS — Z01810 Encounter for preprocedural cardiovascular examination: Secondary | ICD-10-CM

## 2019-06-23 DIAGNOSIS — I10 Essential (primary) hypertension: Secondary | ICD-10-CM | POA: Diagnosis not present

## 2019-06-23 NOTE — Progress Notes (Signed)
Cardiology Office Note:    Date:  06/23/2019   ID:  Kevin Canterbury Sr., DOB 1966/07/07, MRN [id number]  PCP:  Patient, No Pcp Per  Cardiologist:  Jenean Lindau, MD   Referring MD: [place], Sarah T, MD    ASSESSMENT:    1. Preoperative cardiovascular examination   2. Essential hypertension   3. Cigarette smoker    PLAN:    In order of problems listed above:  1. Preoperative or stratification: I discussed my findings with the patient at extensive length.  I reviewed records from previous stress test evaluation and it was inconclusive earlier this year.  I will put him in for a Lexiscan sestamibi.  If this test is negative then he is not at high risk for coronary events during the aforementioned surgery.  Meticulous hemodynamic monitoring will further reduce the risk of coronary events. 2. Essential hypertension: Blood pressure is stable and diet was discussed 3. Cigarette smoker: I spent 5 minutes with the patient discussing solely about smoking. Smoking cessation was counseled. I suggested to the patient also different medications and pharmacological interventions. Patient is keen to try stopping on its own at this time. He will get back to me if he needs any further assistance in this matter. 4. Patient will be seen in follow-up appointment in 6 months or earlier if the patient has any concerns    Medication Adjustments/Labs and Tests Ordered: Current medicines are reviewed at length with the patient today.  Concerns regarding medicines are outlined above.  Orders Placed This Encounter  Procedures  . MYOCARDIAL PERFUSION IMAGING  . EKG 12-Lead  . ECHOCARDIOGRAM COMPLETE   No orders of the defined types were placed in this encounter.    History of Present Illness:    Kevin Seda. is a 53 y.o. male who is being seen today for the evaluation of preoperative risk stratification at the request of [place], Sarah T, MD.  Patient is a poor historian.  He has  history of cigarette smoking and alcohol abuse in the past according to the notes.  He is planning to undergo hip surgery.  He has had lung cancer in the past for which she is being treated.  He denies any chest pain orthopnea or PND.  He leads a very sedentary lifestyle because of orthopedic issues.  He is planning to undergo hip surgery and therefore wants to be evaluated.  At the time of my evaluation, the patient is alert awake oriented and in no distress.  Past Medical History:  Diagnosis Date  . Arthritis   . COPD (chronic obstructive pulmonary disease) ([place])   . ETOH abuse   . Financial difficulty   . Headache   . Hypertension   . Lung cancer ([place]) 11/2017   s/p chemo, radiation  . Meningioma ([place])   . Tobacco abuse     Past Surgical History:  Procedure Laterality Date  . LEFT HEART CATHETERIZATION WITH CORONARY ANGIOGRAM N/A 08/18/2014   Procedure: LEFT HEART CATHETERIZATION WITH CORONARY ANGIOGRAM;  Surgeon: Burnell Blanks, MD;  Location[facility] CATH LAB;  Service: Cardiovascular;  Laterality: N/A;  . SHOULDER ARTHROSCOPY     left    Current Medications: Current Meds  Medication Sig  . albuterol (PROVENTIL HFA;VENTOLIN HFA) 108 (90 Base) MCG/ACT inhaler Inhale 1-2 puffs into the lungs every 4 (four) hours as needed for wheezing or shortness of breath.  Marland Kitchen aspirin EC 81 MG tablet Take 81 mg by mouth daily.  Marland Kitchen atorvastatin (  LIPITOR) 20 MG tablet Take 1 tablet (20 mg total) by mouth daily at 6 PM.  . benzonatate (TESSALON) 100 MG capsule Take 1 capsule (100 mg total) by mouth 3 (three) times daily as needed for cough.  . gabapentin (NEURONTIN) 300 MG capsule Take 300 mg by mouth daily.  Marland Kitchen levothyroxine (SYNTHROID) 100 MCG tablet Take 100 mcg by mouth daily before breakfast.  . oxyCODONE-acetaminophen (PERCOCET) 10-325 MG tablet Take 1 tablet by mouth 4 (four) times daily as needed.     Allergies:   Patient has no known allergies.   Social History   Socioeconomic History   . Marital status: Single    Spouse name: Not on file  . Number of children: 2  . Years of education: 9  . Highest education level: Not on file  Occupational History    Comment: Architect  Social Needs  . Financial resource strain: Not on file  . Food insecurity    Worry: Not on file    Inability: Not on file  . Transportation needs    Medical: Not on file    Non-medical: Not on file  Tobacco Use  . Smoking status: Current Every Day Smoker    Packs/day: 3.00    Types: Cigarettes  . Smokeless tobacco: Former Systems developer  . Tobacco comment: Since age [age]. 06/09/19 Chantix, smoking 1/2-1 PPD  Substance and Sexual Activity  . Alcohol use: Yes    Comment: 06/10/19 "cut down a lot", 6-10 beers every night x 40 year  . Drug use: Yes    Types: Marijuana    Comment: 06/10/19 "every day"  . Sexual activity: Not on file  Lifestyle  . Physical activity    Days per week: Not on file    Minutes per session: Not on file  . Stress: Not on file  Relationships  . Social Herbalist on phone: Not on file    Gets together: Not on file    Attends religious service: Not on file    Active member of club or organization: Not on file    Attends meetings of clubs or organizations: Not on file    Relationship status: Not on file  Other Topics Concern  . Not on file  Social History Narrative   Lives with niece and her boyfriend with their two kids. Also pts GF.      Has front porch steps      Highest level of edu- 9th grade      Right handed      Applying for disability     Family History: The patient's family history includes Hypertension in an other family member; Other in his son; Thyroid cancer in his father. There is no history of CAD.  ROS:   Please see the history of present illness.    All other systems reviewed and are negative.  EKGs/Labs/Other Studies Reviewed:    The following studies were reviewed today: EKG reveals sinus rhythm and nonspecific ST-T changes.    Recent Labs: No results found for requested labs within last 8760 hours.  Recent Lipid Panel    Component Value Date/Time   CHOL 194 08/18/2014 0050   TRIG 110 08/18/2014 0050   HDL 64 08/18/2014 0050   CHOLHDL 3.0 08/18/2014 0050   VLDL 22 08/18/2014 0050   LDLCALC 108 (H) 08/18/2014 0050    Physical Exam:    VS:  BP 130/70   Pulse 86   Ht 6\' 2"  (1.88 m)  Wt 212 lb 9.6 oz (96.4 kg)   SpO2 98%   BMI 27.30 kg/m     Wt Readings from Last 3 Encounters:  06/23/19 212 lb 9.6 oz (96.4 kg)  06/10/19 213 lb (96.6 kg)  01/06/19 229 lb (103.9 kg)     GEN: Patient is in no acute distress HEENT: Normal NECK: No JVD; No carotid bruits LYMPHATICS: No lymphadenopathy CARDIAC: S1 S2 regular, 2/6 systolic murmur at the apex. RESPIRATORY:  Clear to auscultation without rales, wheezing or rhonchi  ABDOMEN: Soft, non-tender, non-distended MUSCULOSKELETAL:  No edema; No deformity  SKIN: Warm and dry NEUROLOGIC:  Alert and oriented x 3 PSYCHIATRIC:  Normal affect    Signed, Jenean Lindau, MD  06/23/2019 3:37 PM    [facility]

## 2019-06-23 NOTE — Patient Instructions (Signed)
Medication Instructions:  Your physician recommends that you continue on your current medications as directed. Please refer to the Current Medication list given to you today.  If you need a refill on your cardiac medications before your next appointment, please call your pharmacy.   Lab work: NONE If you have labs (blood work) drawn today and your tests are completely normal, you will receive your results only by: Marland Kitchen MyChart Message (if you have MyChart) OR . A paper copy in the mail If you have any lab test that is abnormal or we need to change your treatment, we will call you to review the results.  Testing/Procedures: You had an EKG performed today.  Your physician has requested that you have an echocardiogram. Echocardiography is a painless test that uses sound waves to create images of your heart. It provides your doctor with information about the size and shape of your heart and how well your heart's chambers and valves are working. This procedure takes approximately one hour. There are no restrictions for this procedure.  Your physician has requested that you have a lexiscan myoview. For further information please visit [email or web address]. Please follow instruction sheet, as given.    Follow-Up: At [facility], you and your health needs are our priority.  As part of our continuing mission to provide you with exceptional heart care, we have created designated Provider Care Teams.  These Care Teams include your primary Cardiologist (physician) and Advanced Practice Providers (APPs -  Physician Assistants and Nurse Practitioners) who all work together to provide you with the care you need, when you need it. You will need a follow up appointment in 4 months.   Any Other Special Instructions Will Be Listed Below  Regadenoson injection What is this medicine? REGADENOSON is used to test the heart for coronary artery disease. It is used in patients who can not exercise for their stress  test. This medicine may be used for other purposes; ask your health care provider or pharmacist if you have questions. COMMON BRAND NAME(S): Lexiscan What should I tell my health care provider before I take this medicine? They need to know if you have any of these conditions:  heart problems  lung or breathing disease, like asthma or COPD  an unusual or allergic reaction to regadenoson, other medicines, foods, dyes, or preservatives  pregnant or trying to get pregnant  breast-feeding How should I use this medicine? This medicine is for injection into a vein. It is given by a health care professional in a hospital or clinic setting. Talk to your pediatrician regarding the use of this medicine in children. Special care may be needed. Overdosage: If you think you have taken too much of this medicine contact a poison control center or emergency room at once. NOTE: This medicine is only for you. Do not share this medicine with others. What if I miss a dose? This does not apply. What may interact with this medicine?  caffeine  dipyridamole  guarana  theophylline This list may not describe all possible interactions. Give your health care provider a list of all the medicines, herbs, non-prescription drugs, or dietary supplements you use. Also tell them if you smoke, drink alcohol, or use illegal drugs. Some items may interact with your medicine. What should I watch for while using this medicine? Your condition will be monitored carefully while you are receiving this medicine. Do not take medicines, foods, or drinks with caffeine (like coffee, tea, or colas) for at least 12 hours  before your test. If you do not know if something contains caffeine, ask your health care professional. What side effects may I notice from receiving this medicine? Side effects that you should report to your doctor or health care professional as soon as possible:  allergic reactions like skin rash, itching or  hives, swelling of the face, lips, or tongue  breathing problems  chest pain, tightness or palpitations  severe headache Side effects that usually do not require medical attention (report to your doctor or health care professional if they continue or are bothersome):  flushing  headache  irritation or pain at site where injected  nausea, vomiting This list may not describe all possible side effects. Call your doctor for medical advice about side effects. You may report side effects to FDA at 1-800-FDA-1088. Where should I keep my medicine? This drug is given in a hospital or clinic and will not be stored at home. NOTE: This sheet is a summary. It may not cover all possible information. If you have questions about this medicine, talk to your doctor, pharmacist, or health care provider.  2020 Elsevier/Gold Standard (2008-03-02 15:08:13)  Cardiac Nuclear Scan A cardiac nuclear scan is a test that is done to check the flow of blood to your heart. It is done when you are resting and when you are exercising. The test looks for problems such as:  Not enough blood reaching a portion of the heart.  The heart muscle not working as it should. You may need this test if:  You have heart disease.  You have had lab results that are not normal.  You have had heart surgery or a balloon procedure to open up blocked arteries (angioplasty).  You have chest pain.  You have shortness of breath. In this test, a special dye (tracer) is put into your bloodstream. The tracer will travel to your heart. A camera will then take pictures of your heart to see how the tracer moves through your heart. This test is usually done at a hospital and takes 2-4 hours. Tell a doctor about:  Any allergies you have.  All medicines you are taking, including vitamins, herbs, eye drops, creams, and over-the-counter medicines.  Any problems you or family members have had with anesthetic medicines.  Any blood  disorders you have.  Any surgeries you have had.  Any medical conditions you have.  Whether you are pregnant or may be pregnant. What are the risks? Generally, this is a safe test. However, problems may occur, such as:  Serious chest pain and heart attack. This is only a risk if the stress portion of the test is done.  Rapid heartbeat.  A feeling of warmth in your chest. This feeling usually does not last long.  Allergic reaction to the tracer. What happens before the test?  Ask your doctor about changing or stopping your normal medicines. This is important.  Follow instructions from your doctor about what you cannot eat or drink.  Remove your jewelry on the day of the test. What happens during the test?  An IV tube will be inserted into one of your veins.  Your doctor will give you a small amount of tracer through the IV tube.  You will wait for 20-40 minutes while the tracer moves through your bloodstream.  Your heart will be monitored with an electrocardiogram (ECG).  You will lie down on an exam table.  Pictures of your heart will be taken for about 15-20 minutes.  You may  also have a stress test. For this test, one of these things may be done: ? You will be asked to exercise on a treadmill or a stationary bike. ? You will be given medicines that will make your heart work harder. This is done if you are unable to exercise.  When blood flow to your heart has peaked, a tracer will again be given through the IV tube.  After 20-40 minutes, you will get back on the exam table. More pictures will be taken of your heart.  Depending on the tracer that is used, more pictures may need to be taken 3-4 hours later.  Your IV tube will be removed when the test is over. The test may vary among doctors and hospitals. What happens after the test?  Ask your doctor: ? Whether you can return to your normal schedule, including diet, activities, and medicines. ? Whether you should  drink more fluids. This will help to remove the tracer from your body. Drink enough fluid to keep your pee (urine) pale yellow.  Ask your doctor, or the department that is doing the test: ? When will my results be ready? ? How will I get my results? Summary  A cardiac nuclear scan is a test that is done to check the flow of blood to your heart.  Tell your doctor whether you are pregnant or may be pregnant.  Before the test, ask your doctor about changing or stopping your normal medicines. This is important.  Ask your doctor whether you can return to your normal activities. You may be asked to drink more fluids. This information is not intended to replace advice given to you by your health care provider. Make sure you discuss any questions you have with your health care provider. Document Released: 12/17/2017 Document Revised: 10/23/2018 Document Reviewed: 12/17/2017 Elsevier Patient Education  [employer].  Echocardiogram An echocardiogram is a procedure that uses painless sound waves (ultrasound) to produce an image of the heart. Images from an echocardiogram can provide important information about:  Signs of coronary artery disease (CAD).  Aneurysm detection. An aneurysm is a weak or damaged part of an artery wall that bulges out from the normal force of blood pumping through the body.  Heart size and shape. Changes in the size or shape of the heart can be associated with certain conditions, including heart failure, aneurysm, and CAD.  Heart muscle function.  Heart valve function.  Signs of a past heart attack.  Fluid buildup around the heart.  Thickening of the heart muscle.  A tumor or infectious growth around the heart valves. Tell a health care provider about:  Any allergies you have.  All medicines you are taking, including vitamins, herbs, eye drops, creams, and over-the-counter medicines.  Any blood disorders you have.  Any surgeries you have had.  Any  medical conditions you have.  Whether you are pregnant or may be pregnant. What are the risks? Generally, this is a safe procedure. However, problems may occur, including:  Allergic reaction to dye (contrast) that may be used during the procedure. What happens before the procedure? No specific preparation is needed. You may eat and drink normally. What happens during the procedure?   An IV tube may be inserted into one of your veins.  You may receive contrast through this tube. A contrast is an injection that improves the quality of the pictures from your heart.  A gel will be applied to your chest.  A wand-like tool (transducer)  will be moved over your chest. The gel will help to transmit the sound waves from the transducer.  The sound waves will harmlessly bounce off of your heart to allow the heart images to be captured in real-time motion. The images will be recorded on a computer. The procedure may vary among health care providers and hospitals. What happens after the procedure?  You may return to your normal, everyday life, including diet, activities, and medicines, unless your health care provider tells you not to do that. Summary  An echocardiogram is a procedure that uses painless sound waves (ultrasound) to produce an image of the heart.  Images from an echocardiogram can provide important information about the size and shape of your heart, heart muscle function, heart valve function, and fluid buildup around your heart.  You do not need to do anything to prepare before this procedure. You may eat and drink normally.  After the echocardiogram is completed, you may return to your normal, everyday life, unless your health care provider tells you not to do that. This information is not intended to replace advice given to you by your health care provider. Make sure you discuss any questions you have with your health care provider. Document Released: 06/30/2000 Document  Revised: 10/24/2018 Document Reviewed: 08/05/2016 Elsevier Patient Education  2020 [employer].

## 2019-06-24 ENCOUNTER — Ambulatory Visit (HOSPITAL_BASED_OUTPATIENT_CLINIC_OR_DEPARTMENT_OTHER)
Admission: RE | Admit: 2019-06-24 | Discharge: 2019-06-24 | Disposition: A | Discharge status: Home / Self Care | Payer: Medicaid Other | Source: Ambulatory Visit | Attending: Cardiology | Admitting: Cardiology

## 2019-06-24 DIAGNOSIS — I10 Essential (primary) hypertension: Secondary | ICD-10-CM | POA: Diagnosis not present

## 2019-06-24 NOTE — Progress Notes (Signed)
  Echocardiogram 2D Echocardiogram has been performed.  Cardell Peach 06/24/2019, 9:38 AM

## 2019-06-25 ENCOUNTER — Telehealth: Payer: Self-pay

## 2019-06-25 DIAGNOSIS — Z01818 Encounter for other preprocedural examination: Secondary | ICD-10-CM

## 2019-06-25 NOTE — Telephone Encounter (Signed)
Results relayed, copy sent to [facility] per patient request.

## 2019-06-25 NOTE — Telephone Encounter (Signed)
-----   Message from Jenean Lindau, MD sent at 06/24/2019  1:23 PM EST ----- The results of the study is unremarkable. Please inform patient. I will discuss in detail at next appointment. Cc  primary care/referring physician Jenean Lindau, MD 06/24/2019 1:23 PM

## 2019-07-08 ENCOUNTER — Telehealth (HOSPITAL_COMMUNITY): Payer: Self-pay | Admitting: *Deleted

## 2019-07-08 NOTE — Telephone Encounter (Signed)
Left message on voicemail per DPR in reference to upcoming appointment scheduled on 07/15/19 with detailed instructions given per Myocardial Perfusion Study Information Sheet for the test. LM to arrive 15 minutes early, and that it is imperative to arrive on time for appointment to keep from having the test rescheduled. If you need to cancel or reschedule your appointment, please call the office within 24 hours of your appointment. Failure to do so may result in a cancellation of your appointment, and a $50 no show fee. Phone number given for call back for any questions. Kirstie Peri

## 2019-07-15 ENCOUNTER — Ambulatory Visit (INDEPENDENT_AMBULATORY_CARE_PROVIDER_SITE_OTHER): Payer: Medicaid Other

## 2019-07-15 ENCOUNTER — Other Ambulatory Visit: Payer: Self-pay

## 2019-07-15 VITALS — Ht 74.0 in | Wt 212.0 lb

## 2019-07-15 DIAGNOSIS — Z01810 Encounter for preprocedural cardiovascular examination: Secondary | ICD-10-CM | POA: Diagnosis not present

## 2019-07-15 DIAGNOSIS — I10 Essential (primary) hypertension: Secondary | ICD-10-CM

## 2019-07-15 DIAGNOSIS — R110 Nausea: Secondary | ICD-10-CM

## 2019-07-15 LAB — MYOCARDIAL PERFUSION IMAGING
LV dias vol: 91 mL (ref 62–150)
LV sys vol: 35 mL
Peak HR: 103 {beats}/min
Rest HR: 60 {beats}/min
SDS: 0
SRS: 0
SSS: 0
TID: 0.92

## 2019-07-15 MED ORDER — REGADENOSON 0.4 MG/5ML IV SOLN
0.4000 mg | Freq: Once | INTRAVENOUS | Status: AC
  Administered 2019-07-15: 0.4 mg via INTRAVENOUS

## 2019-07-15 MED ORDER — TECHNETIUM TC 99M TETROFOSMIN IV KIT
10.5000 | PACK | Freq: Once | INTRAVENOUS | Status: AC | PRN
  Administered 2019-07-15: 10.5 via INTRAVENOUS

## 2019-07-15 MED ORDER — AMINOPHYLLINE 25 MG/ML IV SOLN
75.0000 mg | Freq: Once | INTRAVENOUS | Status: AC
  Administered 2019-07-15: 75 mg via INTRAVENOUS

## 2019-07-15 MED ORDER — TECHNETIUM TC 99M TETROFOSMIN IV KIT
31.8000 | PACK | Freq: Once | INTRAVENOUS | Status: AC | PRN
  Administered 2019-07-15: 31.8 via INTRAVENOUS

## 2019-08-07 ENCOUNTER — Telehealth: Payer: Self-pay | Admitting: Cardiology

## 2019-08-07 NOTE — Telephone Encounter (Signed)
Juliann Pulse from [facility] requesting a copy of the patients lexiscan. She can be reached at [phone] ext [phone] Fax: [phone]

## 2019-08-07 NOTE — Telephone Encounter (Signed)
Faxed notes to [place] at [facility] clinic.

## 2019-09-12 DIAGNOSIS — C3401 Malignant neoplasm of right main bronchus: Secondary | ICD-10-CM

## 2019-09-22 ENCOUNTER — Telehealth: Payer: Self-pay | Admitting: *Deleted

## 2019-09-22 NOTE — Telephone Encounter (Signed)
Faxed Echo and myocardial results to [facility] at [facility] for surgical clearance

## 2019-12-08 ENCOUNTER — Ambulatory Visit: Payer: Medicaid Other | Admitting: Family Medicine

## 2020-01-09 DIAGNOSIS — C3401 Malignant neoplasm of right main bronchus: Secondary | ICD-10-CM | POA: Diagnosis not present

## 2020-05-20 NOTE — Progress Notes (Signed)
[facility]  [address] [place],  [place]  [address] [phone]  Clinic Day:  05/21/2020  Referring physician: [facility]   HISTORY OF PRESENT ILLNESS:  The patient is a 54 y.o. 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  His definitive therapy consisted of concurrent chemoradiation, which included weekly carboplatin/paclitaxel.  This was followed by a full year of maintenance durvalumab immunotherapy that was completed in September 2020.  He comes in today for routine followup, as well as to review his most recent chest CT.  Since his last visit, the patient has been doing okay.  From a lung cancer perspective, he denies having respiratory symptoms which concern him for overt signs of disease recurrence.  Of note, the patient claims he quit smoking a few weeks ago.  PHYSICAL EXAM:  Vital Signs:  Weight 226 lb, temperature 97.8, pulse 87, respirations 18, blood pressure 150/77   Performance status (ECOG): 0 - Asymptomatic Physical Exam Constitutional:      Appearance: Normal appearance. He is not ill-appearing.  HENT:     Mouth/Throat:     Mouth: Mucous membranes are moist.     Pharynx: Oropharynx is clear. No oropharyngeal exudate or posterior oropharyngeal erythema.  Cardiovascular:     Rate and Rhythm: Normal rate and regular rhythm.     Heart sounds: No murmur heard.  No friction rub. No gallop.   Pulmonary:     Effort: Pulmonary effort is normal. No respiratory distress.     Breath sounds: Normal breath sounds. No wheezing, rhonchi or rales.  Abdominal:     General: Bowel sounds are normal. There is no distension.     Palpations: Abdomen is soft. There is no mass.     Tenderness: There is no abdominal tenderness.  Musculoskeletal:        General: No swelling.     Right lower leg: No edema.     Left lower leg: No edema.  Lymphadenopathy:     Cervical: No cervical adenopathy.     Upper Body:     Right  upper body: No supraclavicular or axillary adenopathy.     Left upper body: No supraclavicular or axillary adenopathy.     Lower Body: No right inguinal adenopathy. No left inguinal adenopathy.  Skin:    General: Skin is warm.     Coloration: Skin is not jaundiced.     Findings: No lesion or rash.  Neurological:     General: No focal deficit present.     Mental Status: He is alert and oriented to person, place, and time. Mental status is at baseline.     Cranial Nerves: Cranial nerves are intact.  Psychiatric:        Mood and Affect: Mood normal.        Behavior: Behavior normal.        Thought Content: Thought content normal.     LABS:        STUDIES:  His chest CT done yesterday revealed the following: FINDINGS: Cardiovascular: Calcified atheromatous plaque of the thoracic aorta. No aneurysmal dilation. Noncalcified plaque as well. Similar to the prior exam. Calcified coronary artery disease of LEFT coronary circulation. Central venous access device terminates in the upper RIGHT atrium similar appearance. No pericardial effusion. Heart size is normal. Central pulmonary vasculature on venous phase assessment unremarkable aside from narrowing of RIGHT upper lobe branches in the setting of RIGHT hilar soft tissue, see below.  Mediastinum/Nodes:  Thoracic inlet structures are normal. No axillary lymphadenopathy. No mediastinal lymphadenopathy.  RIGHT paratracheal nodal tissue which is in continuity with post treatment changes stable between 8 in 12 mm short axis.  RIGHT hilar soft tissue measuring approximately 4.2 x 2.0 cm similar to prior imaging. Findings are compatible with post treatment changes.  Lungs/Pleura: Pulmonary emphysema. Post treatment changes about the RIGHT hilum as described. Bronchiectatic changes and narrowing of upper lobe vascular structures similar to the prior study.  Stable small nodule in the RIGHT middle lobe (image 59 of series 4) 3 mm. No  consolidation. No pleural effusion. Apical scarring bilaterally with similar appearance.  Upper Abdomen: No acute upper abdominal process to the extent evaluated. Imaged portions of the adrenal glands are normal.  Musculoskeletal: No acute musculoskeletal process. No destructive bone finding. Spinal degenerative changes.  IMPRESSION: 1. Post treatment changes about the RIGHT hilum similar to the prior imaging. No signs of disease recurrence. 2. Stable 3 mm nodule in the RIGHT middle lobe. 3. Coronary artery disease. 4. Emphysema and aortic atherosclerosis.  Aortic Atherosclerosis (ICD10-I70.0) and Emphysema (ICD10-J43.9).:   ASSESSMENT & PLAN:   Assessment/Plan:  A 54 y.o. 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  Based upon recent scans, the patient remains disease free.  Clinically, the patient is doing well.  I will see him back in 4 months for repeat clinical assessment.  A chest x-ray will be done on the day of his next visit for his continued radiographic lung cancer surveillance.  The patient understands all the plans discussed today and is in agreement with them.      Coralee Edberg Macarthur Critchley, MD

## 2020-05-21 ENCOUNTER — Encounter: Payer: Self-pay | Admitting: Oncology

## 2020-05-21 ENCOUNTER — Other Ambulatory Visit: Payer: Self-pay

## 2020-05-21 ENCOUNTER — Inpatient Hospital Stay: Payer: Medicare HMO | Attending: Oncology | Admitting: Oncology

## 2020-05-21 ENCOUNTER — Other Ambulatory Visit: Payer: Self-pay | Admitting: Oncology

## 2020-05-21 VITALS — BP 150/77 | HR 87 | Temp 97.8°F | Resp 18 | Ht 72.0 in | Wt 226.1 lb

## 2020-05-21 DIAGNOSIS — C3411 Malignant neoplasm of upper lobe, right bronchus or lung: Secondary | ICD-10-CM | POA: Diagnosis not present

## 2020-07-01 ENCOUNTER — Encounter: Payer: Self-pay | Admitting: Oncology

## 2020-07-05 ENCOUNTER — Encounter: Payer: Self-pay | Admitting: Oncology

## 2020-08-11 ENCOUNTER — Telehealth: Payer: Self-pay

## 2020-08-11 NOTE — Telephone Encounter (Addendum)
I discussed below with Dr Bobby Rumpf. He recommends pt get tested for COVID by seeing PCP or urgent care. He doesn't want to automatically assume this is cancer related.  I called pt back, & notified him of Dr Bobby Rumpf' recommendation. Pt verbalized understanding. He will get COVID testing done soon.    Pt called to report he is having increased SOB (over his baseline), & intermittent pain over right lung area. He is asking if he needs to make appt to see Dr Bobby Rumpf or have scan or something done? His next scheduled appt is in March 2022.  Pt denies chest pain, nasal congestion, runny nose, & chest congestion. He admits to chronic hacking cough - not productive. Afebrile. Has had all 3 COVID injections (including booster). No exposure to anyone with COVID as he knows of. I told pt I would touch base with Dr Bobby Rumpf in the morning, as he is off on Wed afternoons. Pt notified that if chest pain develops or the persistent SOB continues to increase to go to the nearest ER asap. Pt verbalized understanding. [phone].

## 2020-08-23 ENCOUNTER — Telehealth: Payer: Self-pay

## 2020-08-23 NOTE — Telephone Encounter (Signed)
I called pt to notify him that schedulers will be calling him to move up his appt's. Pt verbalized understanding.    I notified Dr Bobby Rumpf of below, & pts request to have scans sooner. Dr Bobby Rumpf agreed, & asked me to notify Denise,scheduler.   Pt sees [place], [place], on Wednesday for his yearly physical. Pt really wants to be seen sooner, & scans.   Pt called to let us know his COVID test was negative. He is still having increased SOB, upper back pain, & pain on the right side of chest like before. [phone]

## 2020-08-27 ENCOUNTER — Telehealth: Payer: Self-pay | Admitting: Oncology

## 2020-08-27 NOTE — Telephone Encounter (Signed)
08/27/20 Spoke with patient and resched all appts

## 2020-09-03 ENCOUNTER — Encounter: Payer: Self-pay | Admitting: Oncology

## 2020-09-06 ENCOUNTER — Other Ambulatory Visit: Payer: Self-pay | Admitting: Hematology and Oncology

## 2020-09-06 ENCOUNTER — Inpatient Hospital Stay: Payer: Medicare HMO | Attending: Oncology

## 2020-09-06 ENCOUNTER — Inpatient Hospital Stay (INDEPENDENT_AMBULATORY_CARE_PROVIDER_SITE_OTHER): Payer: Medicare HMO | Admitting: Oncology

## 2020-09-06 ENCOUNTER — Other Ambulatory Visit: Payer: Self-pay

## 2020-09-06 ENCOUNTER — Telehealth: Payer: Self-pay | Admitting: Oncology

## 2020-09-06 ENCOUNTER — Other Ambulatory Visit: Payer: Self-pay | Admitting: Oncology

## 2020-09-06 VITALS — BP 141/77 | HR 87 | Temp 97.9°F | Resp 18 | Ht 72.0 in | Wt 224.1 lb

## 2020-09-06 DIAGNOSIS — C3411 Malignant neoplasm of upper lobe, right bronchus or lung: Secondary | ICD-10-CM

## 2020-09-06 LAB — COMPREHENSIVE METABOLIC PANEL
Albumin: 4.4 (ref 3.5–5.0)
Calcium: 8.9 (ref 8.7–10.7)

## 2020-09-06 LAB — BASIC METABOLIC PANEL
BUN: 12 (ref 4–21)
CO2: 22 (ref 13–22)
Chloride: 106 (ref 99–108)
Creatinine: 1.1 (ref 0.6–1.3)
Glucose: 110
Potassium: 3.9 (ref 3.4–5.3)
Sodium: 137 (ref 137–147)

## 2020-09-06 LAB — CBC AND DIFFERENTIAL
HCT: 45 (ref 41–53)
Hemoglobin: 15.9 (ref 13.5–17.5)
Neutrophils Absolute: 7.6
Platelets: 237 (ref 150–399)
WBC: 9.5

## 2020-09-06 LAB — HEPATIC FUNCTION PANEL
ALT: 32 (ref 10–40)
AST: 30 (ref 14–40)
Alkaline Phosphatase: 119 (ref 25–125)
Bilirubin, Total: 1.2

## 2020-09-06 LAB — CBC: RBC: 4.74 (ref 3.87–5.11)

## 2020-09-06 MED ORDER — LEVOFLOXACIN 750 MG PO TABS: 750.0000 mg | ORAL_TABLET | Freq: Every day | ORAL | 0 refills | Status: DC

## 2020-09-06 NOTE — Progress Notes (Signed)
Kevin Ritter  [address] [place],  [place]  [address] [phone]  Clinic Day:  09/06/2020  Referring physician: [facility]   HISTORY OF PRESENT ILLNESS:  The patient is a 56 y.o. 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  His definitive therapy consisted of concurrent chemoradiation, which included weekly carboplatin/paclitaxel.  This was followed by a full year of maintenance durvalumab immunotherapy that was completed in September 2020.  He comes in today off-schedule due to having worsening respiratory symptoms, including a productive cough and dyspnea on exertion.  He continue to smoke daily.  His main concern is possible disease recurrence.    PHYSICAL EXAM:  Blood pressure (!) 141/77, pulse 87, temperature 97.9 F (36.6 C), resp. rate 18, height 6' (1.829 m), weight 224 lb 1.6 oz (101.7 kg), SpO2 97 %. Wt Readings from Last 3 Encounters:  09/06/20 224 lb 1.6 oz (101.7 kg)  05/21/20 226 lb 1.6 oz (102.6 kg)  07/15/19 212 lb (96.2 kg)   Body mass index is 30.39 kg/m. Performance status (ECOG): 1 - Symptomatic but completely ambulatory Physical Exam Constitutional:      Appearance: Normal appearance. He is not ill-appearing.  HENT:     Mouth/Throat:     Mouth: Mucous membranes are moist.     Pharynx: Oropharynx is clear. No oropharyngeal exudate or posterior oropharyngeal erythema.  Cardiovascular:     Rate and Rhythm: Normal rate and regular rhythm.     Heart sounds: No murmur heard. No friction rub. No gallop.   Pulmonary:     Effort: Pulmonary effort is normal. No respiratory distress.     Breath sounds: Normal breath sounds. No wheezing, rhonchi or rales.  Chest:  Breasts:     Right: No axillary adenopathy or supraclavicular adenopathy.     Left: No axillary adenopathy or supraclavicular adenopathy.    Abdominal:     General: Bowel sounds are normal. There is no distension.     Palpations:  Abdomen is soft. There is no mass.     Tenderness: There is no abdominal tenderness.  Musculoskeletal:        General: No swelling.     Right lower leg: No edema.     Left lower leg: No edema.  Lymphadenopathy:     Cervical: No cervical adenopathy.     Upper Body:     Right upper body: No supraclavicular or axillary adenopathy.     Left upper body: No supraclavicular or axillary adenopathy.     Lower Body: No right inguinal adenopathy. No left inguinal adenopathy.  Skin:    General: Skin is warm.     Coloration: Skin is not jaundiced.     Findings: No lesion or rash.  Neurological:     General: No focal deficit present.     Mental Status: He is alert and oriented to person, place, and time. Mental status is at baseline.     Cranial Nerves: Cranial nerves are intact.  Psychiatric:        Mood and Affect: Mood normal.        Behavior: Behavior normal.        Thought Content: Thought content normal.     LABS:   CBC Latest Ref Rng & Units 09/06/2020 07/10/2016 08/19/2014  WBC - 9.5 7.2 9.0  Hemoglobin 13.5 - 17.5 15.9 17.9(H) 15.2  Hematocrit 41 - 53 45 49.5 44.6  Platelets 150 - 399 237 248 238  CMP Latest Ref Rng & Units 09/06/2020 07/10/2016 08/19/2014  Glucose 65 - 99 mg/dL - 101(H) 104(H)  BUN 4 - 21 12 8 9   Creatinine 0.6 - 1.3 1.1 1.15 1.09  Sodium 137 - 147 137 135 138  Potassium 3.4 - 5.3 3.9 4.5 4.7  Chloride 99 - 108 106 104 105  CO2 13 - 22 22 24 28   Calcium 8.7 - 10.7 8.9 9.0 8.5  Total Protein 6.5 - 8.1 g/dL - 6.7 -  Total Bilirubin 0.3 - 1.2 mg/dL - 0.5 -  Alkaline Phos 25 - 125 119 82 -  AST 14 - 40 30 28 -  ALT 10 - 40 32 24 -    STUDIES:  His chest x-ray done last week revealed the following: FINDINGS: Right chest port remains in place. Right perihilar scarring and mild chronic right lung volume loss is unchanged from prior exam. There is increased bronchial thickening from prior. No focal airspace disease. Stable biapical pleuroparenchymal scarring.  No pulmonary edema. No pleural fluid or pneumothorax. No acute osseous abnormalities are seen.  IMPRESSION: 1. Increased bronchial thickening from prior exam. 2. Stable right lung scarring related to post treatment change.   ASSESSMENT & PLAN:   Assessment/Plan:  A 55 y.o. 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  In clinic today, I went over his chest x-ray with him, for which he could appreciate that he does not have recurrent disease.  The patient was dyspneic today, but his oxygen level never dropped below 97% while taking multiple laps around the hallway.  I do not think a pulmonary embolus is present.  Ultimately, he may have an underlying bronchitis/pneumonia.  I will prescribe him Levaquin 750mg  daily x 7 days to take.  He knows to speak with his PCP or pulmonologist if his respiratory issues fail to improve over these next few days.  Once again, I encouraged him to completely abstain from smoking.  Otherwise, I will see him back in 4 months for repeat clinical assessment.  A chest CT will be done a day before his next visit for his continued radiographic lung cancer surveillance.  The patient understands all the plans discussed today and is in agreement with them.      Vanetta Rule Macarthur Critchley, MD

## 2020-09-06 NOTE — Telephone Encounter (Signed)
Per 2/21 LOS, patient scheduled for June Follow Up - CT Chest to be ordered for June 20 in May

## 2020-09-21 ENCOUNTER — Other Ambulatory Visit: Payer: Medicare HMO

## 2020-09-21 ENCOUNTER — Ambulatory Visit: Payer: Medicare HMO | Admitting: Oncology

## 2020-11-12 ENCOUNTER — Ambulatory Visit: Payer: Medicaid Other | Admitting: Oncology

## 2020-11-12 ENCOUNTER — Other Ambulatory Visit: Payer: Medicaid Other

## 2021-01-03 NOTE — Progress Notes (Signed)
[facility]  [address] [place],  [place]  [address] [phone]  Clinic Day:  01/06/2021  Referring physician: Maggie Schwalbe, PA-C  This document serves as a record of services personally performed by Marice Potter, MD. It was created on their behalf by Curry,Lauren E, a trained medical scribe. The creation of this record is based on the scribe's personal observations and the provider's statements to them.  HISTORY OF PRESENT ILLNESS:  The patient is a 55 y.o. 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  His definitive therapy consisted of concurrent chemoradiation, which included weekly carboplatin/paclitaxel.  This was followed by a full year of maintenance durvalumab immunotherapy that was completed in September 2020.  He comes in today to review his chest CT images.  Since his last visit, the patient has been doing well.  He denies having any new respiratory symptoms which concern him for disease recurrence.  Of note, he continues to smoke on an intermittent basis.    PHYSICAL EXAM:  Blood pressure (!) 162/81, pulse (!) 57, temperature 98 F (36.7 C), resp. rate 18, height 6' (1.829 m), weight 219 lb (99.3 kg), SpO2 98 %. Wt Readings from Last 3 Encounters:  01/06/21 219 lb (99.3 kg)  09/06/20 224 lb 1.6 oz (101.7 kg)  05/21/20 226 lb 1.6 oz (102.6 kg)   Body mass index is 29.7 kg/m. Performance status (ECOG): 1 - Symptomatic but completely ambulatory Physical Exam Constitutional:      Appearance: Normal appearance. He is not ill-appearing.  HENT:     Mouth/Throat:     Mouth: Mucous membranes are moist.     Pharynx: Oropharynx is clear. No oropharyngeal exudate or posterior oropharyngeal erythema.  Cardiovascular:     Rate and Rhythm: Normal rate and regular rhythm.     Heart sounds: No murmur heard.   No friction rub. No gallop.  Pulmonary:     Effort: Pulmonary effort is normal. No respiratory distress.      Breath sounds: Normal breath sounds. No wheezing, rhonchi or rales.  Chest:  Breasts:    Right: No axillary adenopathy or supraclavicular adenopathy.     Left: No axillary adenopathy or supraclavicular adenopathy.  Abdominal:     General: Bowel sounds are normal. There is no distension.     Palpations: Abdomen is soft. There is no mass.     Tenderness: There is no abdominal tenderness.  Musculoskeletal:        General: No swelling.     Right lower leg: No edema.     Left lower leg: No edema.  Lymphadenopathy:     Cervical: No cervical adenopathy.     Upper Body:     Right upper body: No supraclavicular or axillary adenopathy.     Left upper body: No supraclavicular or axillary adenopathy.     Lower Body: No right inguinal adenopathy. No left inguinal adenopathy.  Skin:    General: Skin is warm.     Coloration: Skin is not jaundiced.     Findings: No lesion or rash.  Neurological:     General: No focal deficit present.     Mental Status: He is alert and oriented to person, place, and time. Mental status is at baseline.     Cranial Nerves: Cranial nerves are intact.  Psychiatric:        Mood and Affect: Mood normal.        Behavior: Behavior normal.  Thought Content: Thought content normal.    LABS:   CBC Latest Ref Rng & Units 01/05/2021 09/06/2020 07/10/2016  WBC - 7.0 9.5 7.2  Hemoglobin 13.5 - 17.5 15.9 15.9 17.9(H)  Hematocrit 41 - 53 46 45 49.5  Platelets 150 - 399 212 237 248   CMP Latest Ref Rng & Units 01/05/2021 09/06/2020 07/10/2016  Glucose 65 - 99 mg/dL - - 101(H)  BUN 4 - 21 20 12 8   Creatinine 0.6 - 1.3 1.1 1.1 1.15  Sodium 137 - 147 140 137 135  Potassium 3.4 - 5.3 4.2 3.9 4.5  Chloride 99 - 108 107 106 104  CO2 13 - 22 24(A) 22 24  Calcium 8.7 - 10.7 8.8 8.9 9.0  Total Protein 6.5 - 8.1 g/dL - - 6.7  Total Bilirubin 0.3 - 1.2 mg/dL - - 0.5  Alkaline Phos 25 - 125 107 119 82  AST 14 - 40 35 30 28  ALT 10 - 40 53(A) 32 24    STUDIES:  His CT  chest done on 01/04/2021 revealed the following: FINDINGS: Cardiovascular: The heart size is normal. No substantial pericardial effusion. Coronary artery calcification is evident. Atherosclerotic calcification is noted in the wall of the thoracic aorta. Right Port-A-Cath tip is positioned in the mid right atrium.  Mediastinum/Nodes: No mediastinal lymphadenopathy. There is no hilar lymphadenopathy. The esophagus has normal imaging features. There is no axillary lymphadenopathy.  Lungs/Pleura: Centrilobular and paraseptal emphysema evident. Biapical pleuroparenchymal scarring again noted. Suprahilar right upper lobe pulmonary lesion measures 5.5 x 4.2 cm today stable when remeasured in a similar fashion on the previous exam at 5.5 x 4.6 cm. Volume loss in the right hemithorax is stable. 3 mm right middle lobe nodule identified previously is stable at 3 mm today (60/4). 5 mm right lower lobe nodule on 53/4 is also unchanged. Stable 3 mm left upper lobe nodule on 66/4. No new suspicious pulmonary nodule or mass. No pleural effusion.  Upper Abdomen: Unremarkable.  Musculoskeletal: No worrisome lytic or sclerotic osseous abnormality. Small sclerotic focus in the right glenoid is likely a bone island.  IMPRESSION: 1. Stable exam.  No new or progressive interval findings. 2. Post treatment scarring in the medial/suprahilar right lung. 3. Stable scattered tiny pulmonary nodules consistent with benign etiology. 4.  Emphysema (ICD10-J43.9) and Aortic Atherosclerosis (ICD10-170.0)  ASSESSMENT & PLAN:   Assessment/Plan:  A 55 y.o. 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  In clinic today, I went over his chest CT with him, for which he could appreciate that he continues to show no evidence of disease recurrence.  Clinically, he is doing well.  I will see him back in 6  months for repeat clinical assessment.  A chest x-ray will be done on the day of his next visit for his continued radiographic lung cancer surveillance.  The patient understands all the plans discussed today and is in agreement with them.     I, Rita Ohara, am acting as scribe for Marice Potter, MD    I have reviewed this report as typed by the medical scribe, and it is complete and accurate.  Dequincy Macarthur Critchley, MD

## 2021-01-04 ENCOUNTER — Inpatient Hospital Stay: Payer: Medicaid Other | Admitting: Oncology

## 2021-01-05 ENCOUNTER — Encounter: Payer: Self-pay | Admitting: Hematology and Oncology

## 2021-01-05 LAB — HEPATIC FUNCTION PANEL
ALT: 53 — AB (ref 10–40)
AST: 35 (ref 14–40)
Alkaline Phosphatase: 107 (ref 25–125)
Bilirubin, Total: 1.2

## 2021-01-05 LAB — CBC AND DIFFERENTIAL
HCT: 46 (ref 41–53)
Hemoglobin: 15.9 (ref 13.5–17.5)
Neutrophils Absolute: 5.32
Platelets: 212 (ref 150–399)
WBC: 7

## 2021-01-05 LAB — BASIC METABOLIC PANEL
BUN: 20 (ref 4–21)
CO2: 24 — AB (ref 13–22)
Chloride: 107 (ref 99–108)
Creatinine: 1.1 (ref 0.6–1.3)
Glucose: 91
Potassium: 4.2 (ref 3.4–5.3)
Sodium: 140 (ref 137–147)

## 2021-01-05 LAB — COMPREHENSIVE METABOLIC PANEL
Albumin: 4.3 (ref 3.5–5.0)
Calcium: 8.8 (ref 8.7–10.7)

## 2021-01-05 LAB — CBC: RBC: 4.72 (ref 3.87–5.11)

## 2021-01-06 ENCOUNTER — Telehealth: Payer: Self-pay | Admitting: Oncology

## 2021-01-06 ENCOUNTER — Inpatient Hospital Stay: Payer: Medicaid Other | Attending: Oncology | Admitting: Oncology

## 2021-01-06 ENCOUNTER — Other Ambulatory Visit: Payer: Self-pay

## 2021-01-06 ENCOUNTER — Other Ambulatory Visit: Payer: Self-pay | Admitting: Oncology

## 2021-01-06 DIAGNOSIS — C3412 Malignant neoplasm of upper lobe, left bronchus or lung: Secondary | ICD-10-CM | POA: Diagnosis not present

## 2021-01-06 DIAGNOSIS — C3411 Malignant neoplasm of upper lobe, right bronchus or lung: Secondary | ICD-10-CM

## 2021-01-06 DIAGNOSIS — C3490 Malignant neoplasm of unspecified part of unspecified bronchus or lung: Secondary | ICD-10-CM | POA: Insufficient documentation

## 2021-01-06 NOTE — Telephone Encounter (Signed)
Per 6/23 los next appt scheduled and given to patient

## 2021-01-07 ENCOUNTER — Encounter: Payer: Self-pay | Admitting: Oncology

## 2021-05-23 ENCOUNTER — Telehealth: Payer: Self-pay

## 2021-05-23 NOTE — Telephone Encounter (Signed)
Pt requested a list of appointment dates from beginning of treatment until present. He needs this for court. I printed the appointment list from [facility] from 12/12/2017 until 05/21/2020. Epic appointment dates printed through 05/23/2021. Pt will come by and pick up the information.

## 2021-06-29 ENCOUNTER — Telehealth: Payer: Self-pay | Admitting: Oncology

## 2021-06-29 NOTE — Telephone Encounter (Signed)
Patient rescheduled from 12/27 X-Ray Chest, Labs, Follow Up to 12/20 - due to Dr Bobby Rumpf being off the wk of 12/26.  Patient aware of changes

## 2021-06-30 NOTE — Progress Notes (Signed)
[facility]  [address] [place],  [place]  [address] [phone]  Clinic Day:  07/05/2021  Referring physician: Maggie Schwalbe, PA-C  This document serves as a record of services personally performed by Marice Potter, MD. It was created on their behalf by Curry,Lauren E, a trained medical scribe. The creation of this record is based on the scribe's personal observations and the provider's statements to them.  HISTORY OF PRESENT ILLNESS:  The patient is a 55 y.o. 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  His definitive therapy consisted of concurrent chemoradiation, which included weekly carboplatin/paclitaxel.  This was followed by a full year of maintenance durvalumab immunotherapy, which was completed in September 2020.  He comes in today to review his chest x-ray images.  Since his last visit, the patient has been doing well.  He denies having any new respiratory symptoms which concern him for disease recurrence.  Of note, he continues to smoke on an intermittent basis.    PHYSICAL EXAM:  Blood pressure 133/84, pulse (!) 102, temperature 97.6 F (36.4 C), resp. rate 18, height 6' (1.829 m), weight 230 lb (104.3 kg), SpO2 97 %. Wt Readings from Last 3 Encounters:  07/05/21 230 lb (104.3 kg)  01/06/21 219 lb (99.3 kg)  09/06/20 224 lb 1.6 oz (101.7 kg)   Body mass index is 31.19 kg/m. Performance status (ECOG): 1 - Symptomatic but completely ambulatory Physical Exam Constitutional:      Appearance: Normal appearance. He is not ill-appearing.  HENT:     Mouth/Throat:     Mouth: Mucous membranes are moist.     Pharynx: Oropharynx is clear. No oropharyngeal exudate or posterior oropharyngeal erythema.  Cardiovascular:     Rate and Rhythm: Normal rate and regular rhythm.     Heart sounds: No murmur heard.   No friction rub. No gallop.  Pulmonary:     Effort: Pulmonary effort is normal. No respiratory distress.      Breath sounds: Normal breath sounds. No wheezing, rhonchi or rales.  Abdominal:     General: Bowel sounds are normal. There is no distension.     Palpations: Abdomen is soft. There is no mass.     Tenderness: There is no abdominal tenderness.  Musculoskeletal:        General: No swelling.     Right lower leg: No edema.     Left lower leg: No edema.  Lymphadenopathy:     Cervical: No cervical adenopathy.     Upper Body:     Right upper body: No supraclavicular or axillary adenopathy.     Left upper body: No supraclavicular or axillary adenopathy.     Lower Body: No right inguinal adenopathy. No left inguinal adenopathy.  Skin:    General: Skin is warm.     Coloration: Skin is not jaundiced.     Findings: No lesion or rash.  Neurological:     General: No focal deficit present.     Mental Status: He is alert and oriented to person, place, and time. Mental status is at baseline.  Psychiatric:        Mood and Affect: Mood normal.        Behavior: Behavior normal.        Thought Content: Thought content normal.   SCANS:  His chest x-ray revealed the following: FINDINGS:  Frontal and lateral views of the chest demonstrates stable right  chest wall port. Chronic post  therapeutic changes are seen in the  right suprahilar region. No new airspace disease, effusion, or  pneumothorax. There are no acute or destructive bony lesions.  IMPRESSION:  1. Stable post therapeutic changes in the right suprahilar region.  No acute intrathoracic process.   LABS:   CBC Latest Ref Rng & Units 07/05/2021 01/05/2021 09/06/2020  WBC - 5.4 7.0 9.5  Hemoglobin 13.5 - 17.5 16.1 15.9 15.9  Hematocrit 41 - 53 47 46 45  Platelets 150 - 399 236 212 237   CMP Latest Ref Rng & Units 07/05/2021 01/05/2021 09/06/2020  Glucose 65 - 99 mg/dL - - -  BUN 4 - 21 15 20 12   Creatinine 0.6 - 1.3 1.2 1.1 1.1  Sodium 137 - 147 137 140 137  Potassium 3.4 - 5.3 3.9 4.2 3.9  Chloride 99 - 108 107 107 106  CO2 13 - 22  23(A) 24(A) 22  Calcium 8.7 - 10.7 8.8 8.8 8.9  Total Protein 6.5 - 8.1 g/dL - - -  Total Bilirubin 0.3 - 1.2 mg/dL - - -  Alkaline Phos 25 - 125 102 107 119  AST 14 - 40 34 35 30  ALT 10 - 40 31 53(A) 32   ASSESSMENT & PLAN:  Assessment/Plan:  A 55 y.o. 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  In clinic today, I went over his chest x-ray images with him, for which he could appreciate that he continues to show no evidence of disease recurrence.  Clinically, he is doing well.  I will see him back in 6 months for repeat clinical assessment.  A CT chest will be done a day before his next visit for his continued radiographic lung cancer surveillance.  Once again, he was encouraged to completely abstain from cigarette smoking.  The patient understands all the plans discussed today and is in agreement with them.     I, Rita Ohara, am acting as scribe for Marice Potter, MD    I have reviewed this report as typed by the medical scribe, and it is complete and accurate.  Kesha Hurrell Macarthur Critchley, MD

## 2021-07-05 ENCOUNTER — Telehealth: Payer: Self-pay | Admitting: Oncology

## 2021-07-05 ENCOUNTER — Inpatient Hospital Stay: Payer: Medicaid Other | Attending: Oncology

## 2021-07-05 ENCOUNTER — Other Ambulatory Visit: Payer: Self-pay | Admitting: Hematology and Oncology

## 2021-07-05 ENCOUNTER — Inpatient Hospital Stay (INDEPENDENT_AMBULATORY_CARE_PROVIDER_SITE_OTHER): Payer: Medicaid Other | Admitting: Oncology

## 2021-07-05 ENCOUNTER — Other Ambulatory Visit: Payer: Self-pay | Admitting: Oncology

## 2021-07-05 VITALS — BP 133/84 | HR 102 | Temp 97.6°F | Resp 18 | Ht 72.0 in | Wt 230.0 lb

## 2021-07-05 DIAGNOSIS — C3410 Malignant neoplasm of upper lobe, unspecified bronchus or lung: Secondary | ICD-10-CM | POA: Diagnosis not present

## 2021-07-05 DIAGNOSIS — C3411 Malignant neoplasm of upper lobe, right bronchus or lung: Secondary | ICD-10-CM

## 2021-07-05 LAB — BASIC METABOLIC PANEL
BUN: 15 (ref 4–21)
CO2: 23 — AB (ref 13–22)
Chloride: 107 (ref 99–108)
Creatinine: 1.2 (ref 0.6–1.3)
Glucose: 124
Potassium: 3.9 (ref 3.4–5.3)
Sodium: 137 (ref 137–147)

## 2021-07-05 LAB — CBC
MCV: 95 — AB (ref 80–94)
RBC: 4.93 (ref 3.87–5.11)

## 2021-07-05 LAB — COMPREHENSIVE METABOLIC PANEL
Albumin: 4.2 (ref 3.5–5.0)
Calcium: 8.8 (ref 8.7–10.7)

## 2021-07-05 LAB — CBC AND DIFFERENTIAL
HCT: 47 (ref 41–53)
Hemoglobin: 16.1 (ref 13.5–17.5)
Neutrophils Absolute: 3.46
Platelets: 236 (ref 150–399)
WBC: 5.4

## 2021-07-05 LAB — HEPATIC FUNCTION PANEL
ALT: 31 (ref 10–40)
AST: 34 (ref 14–40)
Alkaline Phosphatase: 102 (ref 25–125)
Bilirubin, Total: 0.8

## 2021-07-05 NOTE — Telephone Encounter (Signed)
Per 12/20 LOS, patient scheduled for June 2023 Follow Up Appt.  Will order Labs, CT Scans in May for Appt in June after receiving [facility]

## 2021-07-12 ENCOUNTER — Ambulatory Visit: Payer: Medicaid Other | Admitting: Oncology

## 2021-07-12 ENCOUNTER — Other Ambulatory Visit: Payer: Medicaid Other

## 2021-12-19 ENCOUNTER — Telehealth: Payer: Self-pay | Admitting: Oncology

## 2021-12-19 NOTE — Telephone Encounter (Signed)
Contacted pt to notify him of upcoming scan appt. Unable to reach pt or lvm on either home or cell #.   CT Chest w/ Contrast: 01/02/22 @ 10 am; Checking in at 9 am for labs.   Instructions: NPO - 2hrs prior

## 2022-01-02 ENCOUNTER — Encounter: Payer: Self-pay | Admitting: Oncology

## 2022-01-02 NOTE — Progress Notes (Signed)
[facility]  [address] [place],  [place]  [address] [phone]  Clinic Day:  01/03/2022  Referring physician: Maggie Schwalbe, PA-C   HISTORY OF PRESENT ILLNESS:  The patient is a 56 y.o. 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  His definitive therapy consisted of concurrent chemoradiation, which included weekly carboplatin/paclitaxel.  This was followed by a full year of maintenance durvalumab immunotherapy, which was completed in September 2020.  He comes in today to review his chest CT images.  Since his last visit, the patient has been doing well.  He denies having any new respiratory symptoms which concern him for disease recurrence.  Of note, he continues to smoke 5 cigarettes daily.   PHYSICAL EXAM:  Blood pressure 140/79, pulse 68, temperature 98.6 F (37 C), resp. rate 16, height 6' (1.829 m), weight 233 lb 1.6 oz (105.7 kg), SpO2 96 %. Wt Readings from Last 3 Encounters:  01/03/22 233 lb 1.6 oz (105.7 kg)  07/05/21 230 lb (104.3 kg)  01/06/21 219 lb (99.3 kg)   Body mass index is 31.61 kg/m. Performance status (ECOG): 1 - Symptomatic but completely ambulatory Physical Exam Constitutional:      Appearance: Normal appearance. He is not ill-appearing.  HENT:     Mouth/Throat:     Mouth: Mucous membranes are moist.     Pharynx: Oropharynx is clear. No oropharyngeal exudate or posterior oropharyngeal erythema.  Cardiovascular:     Rate and Rhythm: Normal rate and regular rhythm.     Heart sounds: No murmur heard.    No friction rub. No gallop.  Pulmonary:     Effort: Pulmonary effort is normal. No respiratory distress.     Breath sounds: Normal breath sounds. No wheezing, rhonchi or rales.  Abdominal:     General: Bowel sounds are normal. There is no distension.     Palpations: Abdomen is soft. There is no mass.     Tenderness: There is no abdominal tenderness.  Musculoskeletal:        General: No swelling.      Right lower leg: No edema.     Left lower leg: No edema.  Lymphadenopathy:     Cervical: No cervical adenopathy.     Upper Body:     Right upper body: No supraclavicular or axillary adenopathy.     Left upper body: No supraclavicular or axillary adenopathy.     Lower Body: No right inguinal adenopathy. No left inguinal adenopathy.  Skin:    General: Skin is warm.     Coloration: Skin is not jaundiced.     Findings: No lesion or rash.  Neurological:     General: No focal deficit present.     Mental Status: He is alert and oriented to person, place, and time. Mental status is at baseline.  Psychiatric:        Mood and Affect: Mood normal.        Behavior: Behavior normal.        Thought Content: Thought content normal.    SCANS:  His chest CT revealed the following: FINDINGS: Cardiovascular: Right chest port catheter. Aortic atherosclerosis. Normal heart size. Left and right coronary artery calcifications. No pericardial effusion.  Mediastinum/Nodes: Unchanged post treatment appearance of soft tissue about the right hilum (series 2, image 45). No discretely enlarged mediastinal, hilar, or axillary lymph nodes. Thyroid gland, trachea, and esophagus demonstrate no significant findings.  Lungs/Pleura: Unchanged post treatment appearance of a  spiculated mass of the suprahilar right upper lobe measuring 6.6 x 4.3 cm (series 301, image 39). Unchanged 0.3 cm nodule of the peripheral inferior right upper lobe (series 301, image 60) and 0.3 cm nodule of the anterior left upper lobe (series 301, image 39). Moderate centrilobular and paraseptal emphysema. No pleural effusion or pneumothorax.  Upper Abdomen: No acute abnormality. Hepatic steatosis.  Musculoskeletal: No chest wall abnormality. No suspicious osseous lesions identified.  IMPRESSION: 1. Unchanged post treatment appearance of a spiculated mass of the suprahilar right upper lobe. Unchanged post treatment appearance  of soft tissue about the right hilum. No evidence of malignant recurrence. 2. Unchanged small pulmonary nodules, most likely benign and incidental given stability. Attention on follow-up. 3. Emphysema. 4. Coronary artery disease. 5. Hepatic steatosis.  Aortic Atherosclerosis (ICD10-I70.0) and Emphysema (ICD10-J43.9).  LABS:     ASSESSMENT & PLAN:  Assessment/Plan:  A 56 y.o. 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  In clinic today, I went over his chest CT images with him, for which he could appreciate that he continues to show no evidence of disease recurrence.  Clinically, he is doing well.  I will see him back in 6 months for repeat clinical assessment.  A chest x-ray will be done on the day of his next visit for his continued radiographic lung cancer surveillance.  Once again, he was encouraged to completely abstain from cigarette smoking.  The patient understands all the plans discussed today and is in agreement with them.    Brin Ruggerio Macarthur Critchley, MD

## 2022-01-03 ENCOUNTER — Telehealth: Payer: Self-pay | Admitting: Oncology

## 2022-01-03 ENCOUNTER — Encounter: Payer: Self-pay | Admitting: Oncology

## 2022-01-03 ENCOUNTER — Other Ambulatory Visit: Payer: Self-pay | Admitting: Oncology

## 2022-01-03 ENCOUNTER — Inpatient Hospital Stay: Payer: Medicare Other | Attending: Oncology | Admitting: Oncology

## 2022-01-03 VITALS — BP 140/79 | HR 68 | Temp 98.6°F | Resp 16 | Ht 72.0 in | Wt 233.1 lb

## 2022-01-03 DIAGNOSIS — Z95828 Presence of other vascular implants and grafts: Secondary | ICD-10-CM

## 2022-01-03 DIAGNOSIS — C3411 Malignant neoplasm of upper lobe, right bronchus or lung: Secondary | ICD-10-CM

## 2022-01-03 DIAGNOSIS — C3401 Malignant neoplasm of right main bronchus: Secondary | ICD-10-CM | POA: Diagnosis not present

## 2022-01-03 NOTE — Telephone Encounter (Signed)
Per 01/03/22 los next appt scheduled and given to patient

## 2022-06-12 ENCOUNTER — Encounter: Payer: Self-pay | Admitting: Oncology

## 2022-06-12 ENCOUNTER — Telehealth: Payer: Self-pay

## 2022-06-12 DIAGNOSIS — Z95828 Presence of other vascular implants and grafts: Secondary | ICD-10-CM | POA: Insufficient documentation

## 2022-06-12 NOTE — Telephone Encounter (Signed)
Pt called to report that his port hurts. He states there are more blood vessels on it. I asked when port last flushed? He thinks in June. No redness, warmth, nor swelling @ site. I also asked if he had recently lost more weight. He answered, "my weight is up and down". I scheduled him for VAD flush tomorrow, so nurse can check it then.

## 2022-06-13 ENCOUNTER — Other Ambulatory Visit: Payer: Self-pay

## 2022-06-13 ENCOUNTER — Encounter: Payer: Self-pay | Admitting: Oncology

## 2022-06-13 ENCOUNTER — Inpatient Hospital Stay: Payer: Medicare Other

## 2022-06-13 DIAGNOSIS — C3490 Malignant neoplasm of unspecified part of unspecified bronchus or lung: Secondary | ICD-10-CM | POA: Insufficient documentation

## 2022-06-13 DIAGNOSIS — M7989 Other specified soft tissue disorders: Secondary | ICD-10-CM | POA: Diagnosis not present

## 2022-06-13 DIAGNOSIS — F17200 Nicotine dependence, unspecified, uncomplicated: Secondary | ICD-10-CM | POA: Insufficient documentation

## 2022-06-13 DIAGNOSIS — Z95828 Presence of other vascular implants and grafts: Secondary | ICD-10-CM

## 2022-06-13 DIAGNOSIS — C3411 Malignant neoplasm of upper lobe, right bronchus or lung: Secondary | ICD-10-CM

## 2022-06-13 MED ORDER — HEPARIN SOD (PORK) LOCK FLUSH 100 UNIT/ML IV SOLN
500.0000 [IU] | Freq: Once | INTRAVENOUS | Status: AC
  Administered 2022-06-13: 500 [IU]

## 2022-06-13 MED ORDER — SODIUM CHLORIDE 0.9% FLUSH
10.0000 mL | Freq: Once | INTRAVENOUS | Status: AC
  Administered 2022-06-13: 10 mL

## 2022-06-13 NOTE — Progress Notes (Signed)
PT CAME IN TODAY BECAUSE HE HAS BEEN FEELING PAIN AROUND HIS PORT AND UPPER CHEST.  HE STATES THE BLOOD VESSELS FROM THE NIPPLE LINE UP THRU HIS NECK HAVE BEEN ENLARGED.  I ALSO OBSERVED SEVERAL SUPERFICIAL BLOOD VESSELS THAT APPEAR ENLARGED.  PORT FLUSH DONE, AND PORT IS WORKING APPROPRIATELY.  FLUSHED EASILY WITH GOOD BLOOD RETURN.  I REPORTED THIS INFO TO TAMMY, CMA/DR LEWIS AND HE HAS BEEN SCHEDULED TO SEE DR LEWIS TOMORROW MORNING 11/30 TO EVALUATE.  PT IS AWARE AND AGREEABLE

## 2022-06-13 NOTE — Progress Notes (Signed)
[facility]  [address] [place],  [place]  [address] [phone]  Clinic Day:  06/14/2022  Referring physician: Maggie Schwalbe, PA-C   HISTORY OF PRESENT ILLNESS:  The patient is a 56 y.o. 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  His definitive therapy consisted of concurrent chemoradiation, which included weekly carboplatin/paclitaxel.  This was followed by a full year of maintenance durvalumab immunotherapy, which was completed in September 2020.  He comes in today to review his chest CT images.  Since his last visit, the patient has been doing well.  He denies having any new respiratory off-schedule due to there being a concern about right neck swelling and SVC syndrome.  He does complain of neck discomfort and superficial veins over his chest being more prominent than previously.  As it pertains to his lung cancer, he denies having any new respiratory symptoms which concerning for overt signs of disease progression.  Unfortunately, the patient continues to smoke on a daily basis despite our past discussions about the importance of smoking abstinence preventing additional aerodigestive malignancy. Okay today  PHYSICAL EXAM:  Blood pressure (!) 145/74, pulse (!) 57, temperature 98.9 F (37.2 C), resp. rate 18, height 6' (1.829 m), weight 223 lb 3.2 oz (101.2 kg), SpO2 95 %. Wt Readings from Last 3 Encounters:  06/14/22 223 lb 3.2 oz (101.2 kg)  01/03/22 233 lb 1.6 oz (105.7 kg)  07/05/21 230 lb (104.3 kg)   Body mass index is 30.27 kg/m. Performance status (ECOG): 1 - Symptomatic but completely ambulatory Physical Exam Constitutional:      Appearance: Normal appearance. He is not ill-appearing.  HENT:     Mouth/Throat:     Mouth: Mucous membranes are moist.     Pharynx: Oropharynx is clear. No oropharyngeal exudate or posterior oropharyngeal erythema.  Neck:     Comments: No neck swelling or signs of SVC  syndrome Cardiovascular:     Rate and Rhythm: Normal rate and regular rhythm.     Heart sounds: No murmur heard.    No friction rub. No gallop.  Pulmonary:     Effort: Pulmonary effort is normal. No respiratory distress.     Breath sounds: Normal breath sounds. No wheezing, rhonchi or rales.  Abdominal:     General: Bowel sounds are normal. There is no distension.     Palpations: Abdomen is soft. There is no mass.     Tenderness: There is no abdominal tenderness.  Musculoskeletal:        General: No swelling.     Right upper arm: Swelling (slight in amount) present.     Right lower leg: No edema.     Left lower leg: No edema.  Lymphadenopathy:     Cervical: No cervical adenopathy.     Upper Body:     Right upper body: No supraclavicular or axillary adenopathy.     Left upper body: No supraclavicular or axillary adenopathy.     Lower Body: No right inguinal adenopathy. No left inguinal adenopathy.  Skin:    General: Skin is warm.     Coloration: Skin is not jaundiced.     Findings: No lesion or rash.  Neurological:     General: No focal deficit present.     Mental Status: He is alert and oriented to person, place, and time. Mental status is at baseline.  Psychiatric:        Mood and Affect: Mood normal.  Behavior: Behavior normal.        Thought Content: Thought content normal.    ASSESSMENT & PLAN:  Assessment/Plan:  A 56 y.o. 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  He is now over 3 years out from all of his definitive therapy.  Per his exam today, my primary concern is he may have a DVT related to his right port, particularly as there is minor swelling seen in his right arm.  Based upon this, I will have him undergo an ultrasound of his right arm/right neck region.  Irrespective of whether this study shows a clot or not, I do believe the patient can  have his port removed.  If a DVT is seen, I will place him on anticoagulation for 3 months.  I will notify him of these results as soon as they become available.  Otherwise, he knows to keep his appointment in December 2023, for which he is scheduled to undergo a chest x-ray for his lung cancer surveillance at that time. The patient understands all the plans discussed today and is in agreement with them.    Kevin Cregg Macarthur Critchley, MD

## 2022-06-14 ENCOUNTER — Inpatient Hospital Stay: Payer: Medicare Other | Attending: Oncology | Admitting: Oncology

## 2022-06-14 ENCOUNTER — Inpatient Hospital Stay: Payer: Medicare Other

## 2022-06-14 ENCOUNTER — Other Ambulatory Visit: Payer: Self-pay | Admitting: Oncology

## 2022-06-14 VITALS — BP 145/74 | HR 57 | Temp 98.9°F | Resp 18 | Ht 72.0 in | Wt 223.2 lb

## 2022-06-14 DIAGNOSIS — Z95828 Presence of other vascular implants and grafts: Secondary | ICD-10-CM

## 2022-06-14 DIAGNOSIS — C3401 Malignant neoplasm of right main bronchus: Secondary | ICD-10-CM

## 2022-06-14 DIAGNOSIS — C3490 Malignant neoplasm of unspecified part of unspecified bronchus or lung: Secondary | ICD-10-CM | POA: Diagnosis not present

## 2022-06-14 DIAGNOSIS — C3411 Malignant neoplasm of upper lobe, right bronchus or lung: Secondary | ICD-10-CM

## 2022-06-14 LAB — CBC WITH DIFFERENTIAL (CANCER CENTER ONLY)
Abs Immature Granulocytes: 0.02 10*3/uL (ref 0.00–0.07)
Basophils Absolute: 0 10*3/uL (ref 0.0–0.1)
Basophils Relative: 1 %
Eosinophils Absolute: 0 10*3/uL (ref 0.0–0.5)
Eosinophils Relative: 1 %
HCT: 46.8 % (ref 39.0–52.0)
Hemoglobin: 16 g/dL (ref 13.0–17.0)
Immature Granulocytes: 0 %
Lymphocytes Relative: 18 %
Lymphs Abs: 1 10*3/uL (ref 0.7–4.0)
MCH: 33.1 pg (ref 26.0–34.0)
MCHC: 34.2 g/dL (ref 30.0–36.0)
MCV: 96.9 fL (ref 80.0–100.0)
Monocytes Absolute: 0.5 10*3/uL (ref 0.1–1.0)
Monocytes Relative: 8 %
Neutro Abs: 4 10*3/uL (ref 1.7–7.7)
Neutrophils Relative %: 72 %
Platelet Count: 261 10*3/uL (ref 150–400)
RBC: 4.83 MIL/uL (ref 4.22–5.81)
RDW: 13.1 % (ref 11.5–15.5)
WBC Count: 5.6 10*3/uL (ref 4.0–10.5)
nRBC: 0 % (ref 0.0–0.2)

## 2022-06-14 LAB — CMP (CANCER CENTER ONLY)
ALT: 32 U/L (ref 0–44)
AST: 26 U/L (ref 15–41)
Albumin: 3.8 g/dL (ref 3.5–5.0)
Alkaline Phosphatase: 101 U/L (ref 38–126)
Anion gap: 8 (ref 5–15)
BUN: 15 mg/dL (ref 6–20)
CO2: 23 mmol/L (ref 22–32)
Calcium: 9 mg/dL (ref 8.9–10.3)
Chloride: 107 mmol/L (ref 98–111)
Creatinine: 1.24 mg/dL (ref 0.61–1.24)
GFR, Estimated: >60 mL/min (ref >60)
Glucose, Bld: 123 mg/dL — ABNORMAL HIGH (ref 70–99)
Potassium: 4.2 mmol/L (ref 3.5–5.1)
Sodium: 138 mmol/L (ref 135–145)
Total Bilirubin: 0.2 mg/dL — ABNORMAL LOW (ref 0.3–1.2)
Total Protein: 7 g/dL (ref 6.5–8.1)

## 2022-06-16 ENCOUNTER — Other Ambulatory Visit: Payer: Self-pay

## 2022-06-19 ENCOUNTER — Telehealth: Payer: Self-pay | Admitting: Oncology

## 2022-06-19 NOTE — Telephone Encounter (Signed)
06/19/22 spoke with patient and gave appt for [facility] removal on 06/23/22@9am 

## 2022-06-22 ENCOUNTER — Encounter: Payer: Self-pay | Admitting: Oncology

## 2022-06-23 LAB — HEPATIC FUNCTION PANEL
ALT: 44 U/L — AB (ref 10–40)
AST: 32 (ref 14–40)
Alkaline Phosphatase: 115 (ref 25–125)
Bilirubin, Total: 1.2

## 2022-06-23 LAB — BASIC METABOLIC PANEL
BUN: 18 (ref 4–21)
CO2: 27 — AB (ref 13–22)
Chloride: 105 (ref 99–108)
Creatinine: 1.2 (ref 0.6–1.3)
Glucose: 115
Potassium: 4.4 mEq/L (ref 3.5–5.1)
Sodium: 138 (ref 137–147)

## 2022-06-23 LAB — CBC AND DIFFERENTIAL
HCT: 45 (ref 41–53)
Hemoglobin: 15.5 (ref 13.5–17.5)
Neutrophils Absolute: 3.4
Platelets: 233 10*3/uL (ref 150–400)
WBC: 5

## 2022-06-23 LAB — CBC: RBC: 4.63 (ref 3.87–5.11)

## 2022-06-23 LAB — PROTIME-INR: Protime: 10.2 (ref 10.0–13.8)

## 2022-06-23 LAB — COMPREHENSIVE METABOLIC PANEL
Albumin: 3.9 (ref 3.5–5.0)
Calcium: 9 (ref 8.7–10.7)

## 2022-06-23 LAB — POCT INR: INR: 1 (ref 0.80–1.20)

## 2022-07-05 ENCOUNTER — Telehealth: Payer: Self-pay | Admitting: Oncology

## 2022-07-05 ENCOUNTER — Ambulatory Visit: Payer: Medicare Other | Admitting: Oncology

## 2022-07-05 ENCOUNTER — Inpatient Hospital Stay: Payer: Medicare Other | Attending: Oncology | Admitting: Oncology

## 2022-07-05 ENCOUNTER — Other Ambulatory Visit: Payer: Medicare Other

## 2022-07-05 ENCOUNTER — Other Ambulatory Visit: Payer: Self-pay | Admitting: Oncology

## 2022-07-05 ENCOUNTER — Encounter: Payer: Self-pay | Admitting: Oncology

## 2022-07-05 VITALS — BP 136/67 | HR 56 | Temp 98.4°F | Resp 16 | Ht 72.0 in | Wt 221.7 lb

## 2022-07-05 DIAGNOSIS — C342 Malignant neoplasm of middle lobe, bronchus or lung: Secondary | ICD-10-CM

## 2022-07-05 DIAGNOSIS — C3401 Malignant neoplasm of right main bronchus: Secondary | ICD-10-CM

## 2022-07-05 LAB — BASIC METABOLIC PANEL WITH GFR
BUN: 18 (ref 4–21)
CO2: 23 — AB (ref 13–22)
Chloride: 105 (ref 99–108)
Creatinine: 1.1 (ref 0.6–1.3)
Glucose: 100
Potassium: 4.1 meq/L (ref 3.5–5.1)
Sodium: 137 (ref 137–147)

## 2022-07-05 LAB — HEPATIC FUNCTION PANEL
ALT: 30 U/L (ref 10–40)
AST: 29 (ref 14–40)
Alkaline Phosphatase: 96 (ref 25–125)
Bilirubin, Total: 1.1

## 2022-07-05 LAB — COMPREHENSIVE METABOLIC PANEL
Albumin: 4.3 (ref 3.5–5.0)
Calcium: 9.1 (ref 8.7–10.7)

## 2022-07-05 LAB — CBC AND DIFFERENTIAL
HCT: 46 (ref 41–53)
Hemoglobin: 16.2 (ref 13.5–17.5)
Neutrophils Absolute: 4.96
Platelets: 267 10*3/uL (ref 150–400)
WBC: 6.7

## 2022-07-05 LAB — CBC: RBC: 4.82 (ref 3.87–5.11)

## 2022-07-05 NOTE — Telephone Encounter (Signed)
07/05/22 Next appt scheduled and confirmed with patient

## 2022-07-05 NOTE — Progress Notes (Signed)
[facility]  [address] [place],  [place]  [address] [phone]  Clinic Day:  07/17/2022  Referring physician: Maggie Schwalbe, PA-C   HISTORY OF PRESENT ILLNESS:  The patient is a 56 y.o. 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  His definitive therapy consisted of concurrent chemoradiation, which included weekly carboplatin/paclitaxel.  This was followed by a full year of maintenance durvalumab immunotherapy, which was completed in September 2020.  He comes in today to review his chest x-ray images.  Since his last visit, the patient has been doing well.  Unfortunately, the patient continues to smoke on a daily basis despite our past discussions about the importance of smoking abstinence preventing additional aerodigestive malignancy.  PHYSICAL EXAM:  Blood pressure 136/67, pulse (!) 56, temperature 98.4 F (36.9 C), resp. rate 16, height 6' (1.829 m), weight 221 lb 11.2 oz (100.6 kg), SpO2 98 %. Wt Readings from Last 3 Encounters:  07/05/22 221 lb 11.2 oz (100.6 kg)  06/14/22 223 lb 3.2 oz (101.2 kg)  01/03/22 233 lb 1.6 oz (105.7 kg)   Body mass index is 30.07 kg/m. Performance status (ECOG): 1 - Symptomatic but completely ambulatory Physical Exam Constitutional:      Appearance: Normal appearance. He is not ill-appearing.  HENT:     Mouth/Throat:     Mouth: Mucous membranes are moist.     Pharynx: Oropharynx is clear. No oropharyngeal exudate or posterior oropharyngeal erythema.  Cardiovascular:     Rate and Rhythm: Normal rate and regular rhythm.     Heart sounds: No murmur heard.    No friction rub. No gallop.  Pulmonary:     Effort: Pulmonary effort is normal. No respiratory distress.     Breath sounds: Normal breath sounds. No wheezing, rhonchi or rales.  Abdominal:     General: Bowel sounds are normal. There is no distension.     Palpations: Abdomen is soft. There is no mass.     Tenderness: There is no  abdominal tenderness.  Musculoskeletal:        General: No swelling.     Right upper arm: No swelling.     Right lower leg: No edema.     Left lower leg: No edema.  Lymphadenopathy:     Cervical: No cervical adenopathy.     Upper Body:     Right upper body: No supraclavicular or axillary adenopathy.     Left upper body: No supraclavicular or axillary adenopathy.     Lower Body: No right inguinal adenopathy. No left inguinal adenopathy.  Skin:    General: Skin is warm.     Coloration: Skin is not jaundiced.     Findings: No lesion or rash.  Neurological:     General: No focal deficit present.     Mental Status: He is alert and oriented to person, place, and time. Mental status is at baseline.  Psychiatric:        Mood and Affect: Mood normal.        Behavior: Behavior normal.        Thought Content: Thought content normal.   SCANS:  His chest x-ray revealed the following: FINDINGS: The heart and mediastinal contours are within normal limits.  No focal consolidation. No pulmonary edema. No pleural effusion. No pneumothorax.  No acute osseous abnormality.  IMPRESSION: No active cardiopulmonary disease.  ASSESSMENT & PLAN:  Assessment/Plan:  A 56 y.o. male with stage IIIB (T4 N2 M0)  squamous cell lung cancer, status post 1 full year of maintenance durvalumab immunotherapy, which was preceded by chemoradiation.  He is now over 3 years out from all of his definitive therapy.  Based upon his exam and chest x-ray today, the patient remains disease free.  Clinically, the patient appears to be doing well.  As that is the case, I will see him back in 6 months for repeat clinical assessment.  A chest CT will be done a day before his next visit for his continued radiographic lung cancer surveillance.  I have once again discussed the importance of complete smoking abstinence, as not lead to another aerodigestive malignancy developing over time.  The patient understands all the plans discussed  today and is in agreement with them.    Kevin Fesperman Macarthur Critchley, MD

## 2022-07-13 ENCOUNTER — Encounter: Payer: Self-pay | Admitting: Oncology

## 2022-07-17 ENCOUNTER — Encounter: Payer: Self-pay | Admitting: Oncology

## 2022-07-24 ENCOUNTER — Encounter: Payer: Self-pay | Admitting: Oncology

## 2022-07-25 ENCOUNTER — Telehealth: Payer: Self-pay

## 2022-07-25 NOTE — Telephone Encounter (Signed)
Request return phone call from [place], [place] made aware.

## 2022-09-18 ENCOUNTER — Inpatient Hospital Stay: Payer: 59 | Attending: Oncology | Admitting: Oncology

## 2022-09-18 ENCOUNTER — Telehealth: Payer: Self-pay

## 2022-09-18 ENCOUNTER — Other Ambulatory Visit: Payer: Self-pay | Admitting: Oncology

## 2022-09-18 ENCOUNTER — Encounter: Payer: Self-pay | Admitting: Oncology

## 2022-09-18 VITALS — BP 160/78 | HR 101 | Temp 98.4°F | Resp 18 | Ht 72.0 in | Wt 226.1 lb

## 2022-09-18 DIAGNOSIS — C3411 Malignant neoplasm of upper lobe, right bronchus or lung: Secondary | ICD-10-CM

## 2022-09-18 NOTE — Telephone Encounter (Addendum)
Pt anxiously asking for appt to see Dr Bobby Rumpf. He was seen in ER. They did CT scan - 2 masses seen (this is the same lung tx in past).     Dr Bobby Rumpf notified of above. CT printed. Appt given for this afternoon.

## 2022-09-18 NOTE — Progress Notes (Signed)
[place]  [address] [place],  [place]  [address] [phone]  Clinic Day:  09/18/2022  Referring physician: Maggie Schwalbe, PA-C   HISTORY OF PRESENT ILLNESS:  The patient is a 57 y.o. 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  His definitive therapy consisted of concurrent chemoradiation, which included weekly carboplatin/paclitaxel.  This was followed by a full year of maintenance durvalumab immunotherapy, which was completed in September 2020.  He comes in today off schedule due to CT scans in the emergency room showing small bilateral pulmonary nodules that may suggest early evidence of disease metastasis.  According to the patient, he began having increasing chest pain over the 24-36 hours.  As his pain became unrelenting in nature, he came into the emergency room room over the weekend for further evaluation.  The cardiac component of his workup came back unremarkable, which led to his chest CT being done.  Due to the small, bilateral pulmonary nodules seen, he comes in today to discuss what needs to be done next for evaluation.  PHYSICAL EXAM:  Blood pressure (!) 160/78, pulse (!) 101, temperature 98.4 F (36.9 C), resp. rate 18, height 6' (1.829 m), weight 226 lb 1.6 oz (102.6 kg), SpO2 95 %. Wt Readings from Last 3 Encounters:  09/18/22 226 lb 1.6 oz (102.6 kg)  07/05/22 221 lb 11.2 oz (100.6 kg)  06/14/22 223 lb 3.2 oz (101.2 kg)   Body mass index is 30.66 kg/m. Performance status (ECOG): 1 - Symptomatic but completely ambulatory Physical Exam Constitutional:      Appearance: Normal appearance. He is not ill-appearing.  HENT:     Mouth/Throat:     Mouth: Mucous membranes are moist.     Pharynx: Oropharynx is clear. No oropharyngeal exudate or posterior oropharyngeal erythema.  Cardiovascular:     Rate and Rhythm: Normal rate and regular rhythm.     Heart sounds: No murmur heard.    No friction rub. No gallop.   Pulmonary:     Effort: Pulmonary effort is normal. No respiratory distress.     Breath sounds: Normal breath sounds. No wheezing, rhonchi or rales.  Abdominal:     General: Bowel sounds are normal. There is no distension.     Palpations: Abdomen is soft. There is no mass.     Tenderness: There is no abdominal tenderness.  Musculoskeletal:        General: No swelling.     Right upper arm: No swelling.     Right lower leg: No edema.     Left lower leg: No edema.  Lymphadenopathy:     Cervical: No cervical adenopathy.     Upper Body:     Right upper body: No supraclavicular or axillary adenopathy.     Left upper body: No supraclavicular or axillary adenopathy.     Lower Body: No right inguinal adenopathy. No left inguinal adenopathy.  Skin:    General: Skin is warm.     Coloration: Skin is not jaundiced.     Findings: No lesion or rash.  Neurological:     General: No focal deficit present.     Mental Status: He is alert and oriented to person, place, and time. Mental status is at baseline.  Psychiatric:        Mood and Affect: Mood normal.        Behavior: Behavior normal.        Thought Content: Thought content normal.  SCANS:  His chest CT over the weekend in the emergency room revealed the following: FINDINGS: Suboptimal evaluation, secondary to motion degradation and poor contrast opacification such that subsegmental emboli could missed.  Cardiovascular: Satisfactory opacification of the pulmonary arteries to the segmental level. No segmental or larger pulmonary embolus. normal heart size. No pericardial effusion.  Mediastinum/Nodes: Prominent mediastinal lymph nodes though nonenlarged since 01/2022 comparison. No enlarged hilar, or axillary lymph nodes. Thyroid gland, trachea, and esophagus demonstrate no significant findings.  Lungs/Pleura:  *Mild-to-moderate centrilobular emphysematous lung changes, best appreciated at the apices. *Spiculated mass at the  suprahilar RIGHT upper lobe, difficult to compare given technique and a lack of breath hold *Enlargement of multiple RIGHT-sided pulmonary nodules, including 0.7 cm at the superior segment of the RIGHT lower lobe, 0.7 cm at the peripheral middle lobe and a new 1.0 cm subpleural nodularity at the medial middle lobe. See key images *No pleural effusion or pneumothorax.  Upper Abdomen: No acute abnormality.  Musculoskeletal: No acute chest wall abnormality. Similar appearance of scattered sclerotic foci within the imaged thoracic spine, similar to 01/2022 comparison. No acute osseous findings.  Review of the MIP images confirms the above findings.  IMPRESSION: 1. No segmental or larger pulmonary embolus. 2. Similar post treatment appearance of suprahilar RIGHT upper lobe lung mass. Interval enlargement of multiple RIGHT-sided pulmonary nodules, as described above, suspicious for disease progression. Attention on cancer treatment imaging follow-up. 3. Emphysema.  ASSESSMENT & PLAN:  Assessment/Plan:  A 57 y.o. 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  He is now over 3-1/2 years out from all of his definitive therapy.  In clinic today, I went over all of his CT scan images with him, for which he could see how small his bilateral pulmonary nodules are.  Although they are concerning for early  metastatic disease, all of these lesions are entirely too small to ascertain their histology.  There still is a possibility these nodules are inflammatory in nature and may dissipate over time.  I will have this gentleman undergo repeat chest CT in 2 months to reassess the status of his lung nodules.  If any of them is enlarging, then a bronchoscopy with biopsy would be set up to establish a tissue diagnosis.  The patient understands all the plans discussed today and is in agreement with  them.    Jp Eastham Macarthur Critchley, MD

## 2022-09-22 ENCOUNTER — Telehealth: Payer: Self-pay

## 2022-09-22 NOTE — Telephone Encounter (Signed)
I notified Dr Bobby Rumpf that pt has walked into clinic to get pain medication. Dr Bobby Rumpf declines to give pain medication as pt's definitve chemoradiation was completed 3 years ago. He does have a couple small nodules that we are monitoring, but they would not cause chest discomfort. Dr Bobby Rumpf recommends pt see his PCP.  I gave the patient Dr Bobby Rumpf' response above. He replied, "I have been to my PCP and he told me to come here". I told I'm sorry, but Dr Bobby Rumpf isn't willing to give pain medication.

## 2022-10-18 ENCOUNTER — Encounter: Payer: Self-pay | Admitting: Oncology

## 2022-11-16 LAB — HEPATIC FUNCTION PANEL
ALT: 36 U/L (ref 10–40)
AST: 29 (ref 14–40)
Alkaline Phosphatase: 82 (ref 25–125)
Bilirubin, Total: 0.4

## 2022-11-16 LAB — CBC: RBC: 5.02 (ref 3.87–5.11)

## 2022-11-16 LAB — CBC AND DIFFERENTIAL
HCT: 49 (ref 41–53)
Hemoglobin: 16.7 (ref 13.5–17.5)
Neutrophils Absolute: 6.36
Platelets: 259 10*3/uL (ref 150–400)
WBC: 8.6

## 2022-11-16 LAB — BASIC METABOLIC PANEL
BUN: 20 (ref 4–21)
CO2: 26 — AB (ref 13–22)
Chloride: 105 (ref 99–108)
Creatinine: 1.2 (ref 0.6–1.3)
EGFR (Non-African Amer.): >60
Glucose: 99
Potassium: 4.5 mEq/L (ref 3.5–5.1)
Sodium: 140 (ref 137–147)

## 2022-11-16 LAB — COMPREHENSIVE METABOLIC PANEL
Albumin: 4.3 (ref 3.5–5.0)
Calcium: 9.3 (ref 8.7–10.7)

## 2022-11-16 NOTE — Progress Notes (Signed)
[facility]  [address] [place],  [place]  [address] [phone]  Clinic Day:  11/17/2022  Referring physician: Leane Call, PA-C   HISTORY OF PRESENT ILLNESS:  The patient is a 57 y.o. 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  His definitive therapy consisted of concurrent chemoradiation, which included weekly carboplatin/paclitaxel.  This was followed by a full year of maintenance durvalumab immunotherapy, which was completed in September 2020.  He comes in today to go over his most recent chest CT to ensure there have been no signs of disease progression.  Since his last visit, the patient has been doing fairly well.  He has chronic chest wall pain, but denies having any new respiratory symptoms which concern him for overt signs of disease progression.  PHYSICAL EXAM:  Blood pressure (!) 189/89, pulse (!) 50, temperature 97.9 F (36.6 C), resp. rate 18, height 6' (1.829 m), weight 227 lb 11.2 oz (103.3 kg), SpO2 98 %. Wt Readings from Last 3 Encounters:  11/17/22 227 lb 11.2 oz (103.3 kg)  09/18/22 226 lb 1.6 oz (102.6 kg)  07/05/22 221 lb 11.2 oz (100.6 kg)   Body mass index is 30.88 kg/m. Performance status (ECOG): 1 - Symptomatic but completely ambulatory Physical Exam Constitutional:      Appearance: Normal appearance. He is not ill-appearing.  HENT:     Mouth/Throat:     Mouth: Mucous membranes are moist.     Pharynx: Oropharynx is clear. No oropharyngeal exudate or posterior oropharyngeal erythema.  Cardiovascular:     Rate and Rhythm: Normal rate and regular rhythm.     Heart sounds: No murmur heard.    No friction rub. No gallop.  Pulmonary:     Effort: Pulmonary effort is normal. No respiratory distress.     Breath sounds: Normal breath sounds. No wheezing, rhonchi or rales.  Abdominal:     General: Bowel sounds are normal. There is no distension.     Palpations: Abdomen is soft. There is no mass.      Tenderness: There is no abdominal tenderness.  Musculoskeletal:        General: No swelling.     Right upper arm: No swelling.     Right lower leg: No edema.     Left lower leg: No edema.  Lymphadenopathy:     Cervical: No cervical adenopathy.     Upper Body:     Right upper body: No supraclavicular or axillary adenopathy.     Left upper body: No supraclavicular or axillary adenopathy.     Lower Body: No right inguinal adenopathy. No left inguinal adenopathy.  Skin:    General: Skin is warm.     Coloration: Skin is not jaundiced.     Findings: No lesion or rash.  Neurological:     General: No focal deficit present.     Mental Status: He is alert and oriented to person, place, and time. Mental status is at baseline.  Psychiatric:        Mood and Affect: Mood normal.        Behavior: Behavior normal.        Thought Content: Thought content normal.   SCANS:  His chest CT done yesterday revealed the following: FINDINGS: Cardiovascular: Bovine arch. Aortic atherosclerosis. Normal heart size, without pericardial effusion. Left main and 3 vessel coronary artery calcification. No central pulmonary embolism, on this non-dedicated study. Collateral veins about the upper chest are similar  and likely related to central venous insufficiency within the right brachiocephalic vein and/or SVC.  Mediastinum/Nodes: No mediastinal or hilar adenopathy.  Lungs/Pleura: No pleural fluid. Moderate centrilobular emphysema. Left-greater-than-right apical pleuroparenchymal scarring.  Similar right upper lobe bronchial tree narrowing. Example 38/301.  Treated mass within the medial right upper lobe measures on the order of 4.2 x 4.7 cm on 33/301 versus 4.6 x 6.0 on the prior exam. Surrounding radiation induced consolidation and volume loss extends caudally to include the right perihilar lung. Evolution of radiation change within the anteromedial right middle lobe, with increased volume loss and  decreased ground-glass including on 48/301.  Primarily similar bilateral pulmonary nodules, identified on series 301. No new or enlarging nodules. Examples in the left upper lobe x2 at maximally 4 mm on 31//301.  Within the right lower lobe at 7 mm on 49/301. Within the right middle lobe at 6 mm on 55/301.  Anteromedial pleural-based right middle lobe nodule is decreased at 7 mm on 69/301 versus 1.0 cm on the prior.  Upper Abdomen: Normal imaged portions of the liver, spleen, stomach, pancreas, gallbladder, adrenal glands, kidneys.  Musculoskeletal: Right glenoid 6 mm sclerotic lesion is similar back to 12/09/2019, favoring a benign bone island. Thoracic spine tiny sclerotic lesions are positioned at the discs and likely degenerative/related to Schmorl's nodes.  IMPRESSION: 1. Decrease in treated right upper lobe lung mass with evolution of surrounding radiation change. 2. Primarily similar nonspecific bilateral pulmonary nodules. The new nodule in the right middle lobe on the prior exam is decreased and may have been infectious/inflammatory. 3. No thoracic adenopathy. 4. Aortic atherosclerosis (ICD10-I70.0) and emphysema (ICD10-J43.9). 5. Age advanced coronary artery atherosclerosis. Recommend assessment of coronary risk factors.  ASSESSMENT & PLAN:  Assessment/Plan:  A 57 y.o. 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  In clinic today, I went over all of his CT scan images with him, for which he could see how his small bilateral pulmonary nodules are stable.  They likely do not represent cancer,  Furthermore, the predominant right hilar mass is smaller, which suggests continued postradiation change.  Understandably, the patient was pleased with his chest CT images.  I will see him back in 6 months, with a repeat chest CT being done at that time for his continued  radiographic lung cancer surveillance. The patient understands all the plans discussed today and is in agreement with them.    Rocky Rishel Kirby Funk, MD

## 2022-11-17 ENCOUNTER — Inpatient Hospital Stay: Payer: 59 | Attending: Oncology | Admitting: Oncology

## 2022-11-17 ENCOUNTER — Telehealth: Payer: Self-pay

## 2022-11-17 VITALS — BP 189/89 | HR 50 | Temp 97.9°F | Resp 18 | Ht 72.0 in | Wt 227.7 lb

## 2022-11-17 DIAGNOSIS — C3401 Malignant neoplasm of right main bronchus: Secondary | ICD-10-CM

## 2022-11-17 NOTE — Telephone Encounter (Signed)
Dr Melvyn Neth asked me to call pt and "tell him that the CT report confirms that the changes in the primary mass are likely due to the radiation. I want to see him in 6 months with CT before". Pt notified of above and verbalized understanding.

## 2022-11-28 ENCOUNTER — Encounter: Payer: Self-pay | Admitting: Oncology

## 2023-01-04 ENCOUNTER — Ambulatory Visit: Payer: Medicare Other | Admitting: Oncology

## 2023-03-20 ENCOUNTER — Ambulatory Visit: Payer: 59 | Admitting: Oncology

## 2023-05-07 ENCOUNTER — Telehealth: Payer: Self-pay | Admitting: Oncology

## 2023-05-07 NOTE — Telephone Encounter (Signed)
CT Chest has been scheduled for 06/04/23 @ 1; Checking in @ 12 pm   Notified pt of date,time and instructions.

## 2023-06-04 LAB — COMPREHENSIVE METABOLIC PANEL: EGFR: 60

## 2023-06-04 NOTE — Progress Notes (Unsigned)
[facility]  [address] [place],  [place]  [address] [phone]  Clinic Day:  06/05/2023  Referring physician: Leane Call, PA-C   HISTORY OF PRESENT ILLNESS:  The patient is a 57 y.o. 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  His definitive therapy consisted of concurrent chemoradiation, which included weekly carboplatin/paclitaxel.  This was followed by a full year of maintenance durvalumab immunotherapy, which was completed in September 2020.  He comes in today to go over his most recent chest CT to ensure there have been no signs of disease progression.  Since his last visit, the patient has been doing fairly well.  He denies having any new respiratory symptoms which concern him for overt signs of disease progression.  Although seldomly, he still smokes.    PHYSICAL EXAM:  Blood pressure (!) 141/80, pulse (!) 54, temperature 98.5 F (36.9 C), resp. rate 18, height 6' (1.829 m), weight 228 lb 6.4 oz (103.6 kg), SpO2 98%. Wt Readings from Last 3 Encounters:  06/05/23 228 lb 6.4 oz (103.6 kg)  11/17/22 227 lb 11.2 oz (103.3 kg)  09/18/22 226 lb 1.6 oz (102.6 kg)   Body mass index is 30.98 kg/m. Performance status (ECOG): 1 - Symptomatic but completely ambulatory Physical Exam Constitutional:      Appearance: Normal appearance. He is not ill-appearing.  HENT:     Mouth/Throat:     Mouth: Mucous membranes are moist.     Pharynx: Oropharynx is clear. No oropharyngeal exudate or posterior oropharyngeal erythema.  Cardiovascular:     Rate and Rhythm: Normal rate and regular rhythm.     Heart sounds: No murmur heard.    No friction rub. No gallop.  Pulmonary:     Effort: Pulmonary effort is normal. No respiratory distress.     Breath sounds: Normal breath sounds. No wheezing, rhonchi or rales.  Abdominal:     General: Bowel sounds are normal. There is no distension.     Palpations: Abdomen is soft. There is no mass.      Tenderness: There is no abdominal tenderness.  Musculoskeletal:        General: No swelling.     Right upper arm: No swelling.     Right lower leg: No edema.     Left lower leg: No edema.  Lymphadenopathy:     Cervical: No cervical adenopathy.     Upper Body:     Right upper body: No supraclavicular or axillary adenopathy.     Left upper body: No supraclavicular or axillary adenopathy.     Lower Body: No right inguinal adenopathy. No left inguinal adenopathy.  Skin:    General: Skin is warm.     Coloration: Skin is not jaundiced.     Findings: No lesion or rash.  Neurological:     General: No focal deficit present.     Mental Status: He is alert and oriented to person, place, and time. Mental status is at baseline.  Psychiatric:        Mood and Affect: Mood normal.        Behavior: Behavior normal.        Thought Content: Thought content normal.   SCANS:  His chest CT done yesterday revealed the following: FINDINGS: Cardiovascular: The heart is normal in size. There is no pericardial effusion.  Aorta: The aorta is normal in caliber. No aortic aneurysm is identified. Atherosclerotic vascular calcifications are present within the coronary vasculature  and thoracic aorta. There is conventional anatomy of the great vessels.  Mediastinum: There is no mediastinal, hilar, or axillary lymphadenopathy.  Lungs: Diffuse centrilobular emphysematous change and biapical pleuroparenchymal scarring redemonstrated. Once again, a grossly stable, by size, 5.9 x 4.4 cm right suprahilar spiculated mass partially encases and continues to mildly narrow the branching right upper lobe bronchi (301:40). Stable, by size, additional smaller nodules measure 0.6 cm anterior to the dominant mass (301:38), 0.6 cm posterior to the dominant mass (301:38) and 0.4 cm within the lateral right middle lobe (301:59). A stable appearing pulmonary nodule within the posterolateral right lower lobe measures 0.6 cm (301:59).  No new pulmonary nodules are identified. No new pulmonary nodules are identified.  Pleura: No pleural effusions or pneumothorax is present.  Chest wall: Unremarkable.  Upper abdomen: The partially imaged upper abdominal organs are unremarkable.  Osseous: Stable sclerotic foci adjacent to the superior endplates of T7 and T11, likely degenerative. Chronic appearing anterior wedging deformity of the T8 vertebral body has resulted in approximately 25% central and anterior height loss. No evidence of acute osseous abnormality.  IMPRESSION: Stable dominant right suprahilar pulmonary mass encasing and mildly narrowing the branching right upper lobe bronchi with multiple adjacent pulmonary nodules within the right upper, middle and lower lobes, all of which are stable, by size, when compared to the prior exam dated 01/12/2023.  ASSESSMENT & PLAN:  Assessment/Plan:  A 57 y.o. 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  He completed 1 full year of maintenance durvalumab immunotherapy, which was preceded by chemoradiation.  In clinic today, I went over all of his CT scan images with him, for which he could see how his small bilateral pulmonary nodules are stable.  They likely do not represent cancer.  Furthermore, the predominant right hilar mass is smaller, which suggests continued postradiation change.  Understandably, the patient was pleased with his chest CT images.  I will see him back in 6 months, with a repeat chest CT being done at that time for his continued radiographic lung cancer surveillance. The patient understands all the plans discussed today and is in agreement with them.    Madalyne Husk Kirby Funk, MD

## 2023-06-05 ENCOUNTER — Inpatient Hospital Stay: Payer: 59 | Attending: Oncology | Admitting: Oncology

## 2023-06-05 ENCOUNTER — Telehealth: Payer: Self-pay | Admitting: Oncology

## 2023-06-05 ENCOUNTER — Other Ambulatory Visit: Payer: Self-pay | Admitting: Oncology

## 2023-06-05 VITALS — BP 141/80 | HR 54 | Temp 98.5°F | Resp 18 | Ht 72.0 in | Wt 228.4 lb

## 2023-06-05 DIAGNOSIS — C3401 Malignant neoplasm of right main bronchus: Secondary | ICD-10-CM

## 2023-06-05 DIAGNOSIS — F17200 Nicotine dependence, unspecified, uncomplicated: Secondary | ICD-10-CM | POA: Diagnosis not present

## 2023-06-05 DIAGNOSIS — Z923 Personal history of irradiation: Secondary | ICD-10-CM | POA: Diagnosis not present

## 2023-06-05 DIAGNOSIS — Z9221 Personal history of antineoplastic chemotherapy: Secondary | ICD-10-CM | POA: Insufficient documentation

## 2023-06-05 DIAGNOSIS — C3490 Malignant neoplasm of unspecified part of unspecified bronchus or lung: Secondary | ICD-10-CM | POA: Insufficient documentation

## 2023-06-05 NOTE — Telephone Encounter (Signed)
Patient has been scheduled for follow-up visit per 06/05/23 LOS.  Pt given an appt calendar with date and time.

## 2023-06-12 ENCOUNTER — Encounter: Payer: Self-pay | Admitting: Oncology

## 2023-06-19 ENCOUNTER — Encounter: Payer: Self-pay | Admitting: Oncology

## 2023-09-11 ENCOUNTER — Telehealth: Payer: Self-pay

## 2023-09-11 ENCOUNTER — Encounter: Payer: Self-pay | Admitting: Oncology

## 2023-09-11 NOTE — Telephone Encounter (Signed)
 I called pt back to get more information. Pt states he has started having pain in his right lung that goes to his shoulder and back. It is a burning sensation. He mentioned having EMS come out to his home and checked him out. He is having intermittent increase in SOB. Cough is mainly dry, and hacking. He occasionally has productive cough w/white phlegm. He denies fever, night sweats, and no recent upper resp infections. He jsut wants the scan and to see Lewis before scheduled April 2025 appt.

## 2023-09-20 ENCOUNTER — Ambulatory Visit (HOSPITAL_BASED_OUTPATIENT_CLINIC_OR_DEPARTMENT_OTHER)
Admission: RE | Admit: 2023-09-20 | Discharge: 2023-09-20 | Disposition: A | Discharge status: Home / Self Care | Payer: 59 | Source: Ambulatory Visit | Attending: Oncology | Admitting: Oncology

## 2023-09-20 DIAGNOSIS — C3401 Malignant neoplasm of right main bronchus: Secondary | ICD-10-CM | POA: Diagnosis not present

## 2023-09-20 MED ORDER — IOHEXOL 300 MG/ML  SOLN
100.0000 mL | Freq: Once | INTRAMUSCULAR | Status: AC | PRN
  Administered 2023-09-20: 75 mL via INTRAVENOUS

## 2023-09-20 NOTE — Progress Notes (Signed)
 [facility]  [address] [place],  [place]  [address] [phone]  Clinic Day:  09/21/2023  Referring physician: Leane Call, PA-C   HISTORY OF PRESENT ILLNESS:  The patient is a 58 y.o. 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  His definitive therapy consisted of concurrent chemoradiation, which included weekly carboplatin/paclitaxel.  This was followed by a full year of maintenance durvalumab immunotherapy, which was completed in September 2020.  He comes in today to go over his most recent chest CT to ensure there have been no signs of disease progression.  Since his last visit, the patient has been doing okay.  He claims he has recently had stroke-like symptoms which fortunately did not leave any neurological changes.   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  With respect to his lung cancer, he claims to have midline chest discomfort ever since he had his stroke-like symptoms.   He denies having any new respiratory symptoms which concern him for overt signs of disease progression.  He still smokes on a minimal basis.    PHYSICAL EXAM:  Blood pressure (!) 143/70, pulse 68, temperature 98 F (36.7 C), temperature source Oral, resp. rate 20, height 6' (1.829 m), weight 239 lb 4.8 oz (108.5 kg), SpO2 93%. Wt Readings from Last 3 Encounters:  09/21/23 239 lb 4.8 oz (108.5 kg)  06/05/23 228 lb 6.4 oz (103.6 kg)  11/17/22 227 lb 11.2 oz (103.3 kg)   Body mass index is 32.45 kg/m. Performance status (ECOG): 1 - Symptomatic but completely ambulatory Physical Exam Constitutional:      Appearance: Normal appearance. He is not ill-appearing.  HENT:     Mouth/Throat:     Mouth: Mucous membranes are moist.     Pharynx: Oropharynx is clear. No oropharyngeal exudate or posterior oropharyngeal erythema.  Cardiovascular:     Rate and Rhythm: Normal rate and regular rhythm.     Heart sounds: No murmur  heard.    No friction rub. No gallop.  Pulmonary:     Effort: Pulmonary effort is normal. No respiratory distress.     Breath sounds: Normal breath sounds. No wheezing, rhonchi or rales.  Abdominal:     General: Bowel sounds are normal. There is no distension.     Palpations: Abdomen is soft. There is no mass.     Tenderness: There is no abdominal tenderness.  Musculoskeletal:        General: No swelling.     Right upper arm: No swelling.     Right lower leg: No edema.     Left lower leg: No edema.  Lymphadenopathy:     Cervical: No cervical adenopathy.     Upper Body:     Right upper body: No supraclavicular or axillary adenopathy.     Left upper body: No supraclavicular or axillary adenopathy.     Lower Body: No right inguinal adenopathy. No left inguinal adenopathy.  Skin:    General: Skin is warm.     Coloration: Skin is not jaundiced.     Findings: No lesion or rash.  Neurological:     General: No focal deficit present.     Mental Status: He is alert and oriented to person, place, and time. Mental status is at baseline.  Psychiatric:        Mood and Affect: Mood normal.        Behavior: Behavior normal.  Thought Content: Thought content normal.   SCANS:  His chest CT done yesterday revealed the following: FINDINGS: Cardiovascular: The heart is normal in size. There is no pericardial effusion.  Aorta: The aorta is normal in caliber. No aortic aneurysm is identified. Atherosclerotic vascular calcifications are present within the coronary vasculature and thoracic aorta. There is conventional anatomy of the great vessels.  Mediastinum: There is no mediastinal, hilar, or axillary lymphadenopathy.  Lungs: Diffuse centrilobular emphysematous change and biapical pleuroparenchymal scarring redemonstrated. Once again, a grossly stable, by size, 5.9 x 4.4 cm right suprahilar spiculated mass partially encases and continues to mildly narrow the branching right upper lobe bronchi  (301:40). Stable, by size, additional smaller nodules measure 0.6 cm anterior to the dominant mass (301:38), 0.6 cm posterior to the dominant mass (301:38) and 0.4 cm within the lateral right middle lobe (301:59). A stable appearing pulmonary nodule within the posterolateral right lower lobe measures 0.6 cm (301:59). No new pulmonary nodules are identified. No new pulmonary nodules are identified.  Pleura: No pleural effusions or pneumothorax is present.  Chest wall: Unremarkable.  Upper abdomen: The partially imaged upper abdominal organs are unremarkable.  Osseous: Stable sclerotic foci adjacent to the superior endplates of T7 and T11, likely degenerative. Chronic appearing anterior wedging deformity of the T8 vertebral body has resulted in approximately 25% central and anterior height loss. No evidence of acute osseous abnormality.  IMPRESSION: Stable dominant right suprahilar pulmonary mass encasing and mildly narrowing the branching right upper lobe bronchi with multiple adjacent pulmonary nodules within the right upper, middle and lower lobes, all of which are stable, by size, when compared to the prior exam dated 01/12/2023.  ASSESSMENT & PLAN:  Assessment/Plan:  A 57 y.o. 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  He completed 1 full year of maintenance durvalumab immunotherapy, which was preceded by chemoradiation.  In clinic today, I went over all of his CT scan images with him, for which he could see he has no signs of active lung cancer.  His predominant right hilar mass is stable, consistent with chronic postradiation changes.  Understandably, the patient was pleased with his chest CT images.  I will see him back in 6 months, with a PET scan being done at that time to ensure there is no evidence of any active lung cancer being present.  If not, it would mark him being 5 years cancer free, for which I would  consider him cured of his lung cancer.  The patient understands all the plans discussed today and is in agreement with them.    Zelta Enfield Kirby Funk, MD

## 2023-09-21 ENCOUNTER — Other Ambulatory Visit: Payer: Self-pay | Admitting: Oncology

## 2023-09-21 ENCOUNTER — Inpatient Hospital Stay: Payer: 59 | Attending: Oncology | Admitting: Oncology

## 2023-09-21 ENCOUNTER — Encounter: Payer: Self-pay | Admitting: Oncology

## 2023-09-21 VITALS — BP 143/70 | HR 68 | Temp 98.0°F | Resp 20 | Ht 72.0 in | Wt 239.3 lb

## 2023-09-21 DIAGNOSIS — C3401 Malignant neoplasm of right main bronchus: Secondary | ICD-10-CM | POA: Diagnosis not present

## 2023-09-21 DIAGNOSIS — Z85118 Personal history of other malignant neoplasm of bronchus and lung: Secondary | ICD-10-CM | POA: Diagnosis present

## 2023-09-21 DIAGNOSIS — Z923 Personal history of irradiation: Secondary | ICD-10-CM | POA: Diagnosis not present

## 2023-09-21 DIAGNOSIS — R0789 Other chest pain: Secondary | ICD-10-CM | POA: Diagnosis not present

## 2023-09-21 DIAGNOSIS — F17200 Nicotine dependence, unspecified, uncomplicated: Secondary | ICD-10-CM | POA: Diagnosis not present

## 2023-09-21 DIAGNOSIS — R918 Other nonspecific abnormal finding of lung field: Secondary | ICD-10-CM | POA: Diagnosis not present

## 2023-09-21 DIAGNOSIS — Z9221 Personal history of antineoplastic chemotherapy: Secondary | ICD-10-CM | POA: Diagnosis not present

## 2023-09-23 ENCOUNTER — Encounter: Payer: Self-pay | Admitting: Oncology

## 2023-12-04 ENCOUNTER — Ambulatory Visit: Payer: 59 | Admitting: Oncology

## 2024-03-03 ENCOUNTER — Encounter: Payer: Self-pay | Admitting: Oncology

## 2024-03-19 NOTE — Progress Notes (Signed)
 [facility] [address] [place],  [place]  [address] [phone]  Clinic Day:  03/20/2024  Referring physician: Hunter Mickey Browner, PA-C   HISTORY OF PRESENT ILLNESS:  The patient is a 58 y.o. 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  His definitive therapy consisted of concurrent chemoradiation, which included weekly carboplatin/paclitaxel.  This was followed by a full year of maintenance durvalumab immunotherapy, which was completed in September 2020.  He comes in today to go over his PET scan done yesterday to ensure there are no signs of disease recurrence.  Since his last visit, the patient has been doing fairly well.  Since he had stroke-like symptoms earlier this year, he has no longer smoked. With respect to his lung cancer, he denies having any new respiratory symptoms which concern him for overt signs of disease progression.  He still smokes on a minimal basis.    PHYSICAL EXAM:  Blood pressure (!) 147/87, pulse 84, temperature 97.8 F (36.6 C), temperature source Oral, resp. rate 16, height 6' (1.829 m), weight 229 lb (103.9 kg), SpO2 98%. Wt Readings from Last 3 Encounters:  03/20/24 229 lb (103.9 kg)  09/21/23 239 lb 4.8 oz (108.5 kg)  06/05/23 228 lb 6.4 oz (103.6 kg)   Body mass index is 31.06 kg/m. Performance status (ECOG): 1 - Symptomatic but completely ambulatory Physical Exam Constitutional:      Appearance: Normal appearance. He is not ill-appearing.  HENT:     Mouth/Throat:     Mouth: Mucous membranes are moist.     Pharynx: Oropharynx is clear. No oropharyngeal exudate or posterior oropharyngeal erythema.  Cardiovascular:     Rate and Rhythm: Normal rate and regular rhythm.     Heart sounds: No murmur heard.    No friction rub. No gallop.  Pulmonary:     Effort: Pulmonary effort is normal. No respiratory distress.     Breath sounds: Normal breath sounds. No wheezing, rhonchi or rales.  Abdominal:      General: Bowel sounds are normal. There is no distension.     Palpations: Abdomen is soft. There is no mass.     Tenderness: There is no abdominal tenderness.  Musculoskeletal:        General: No swelling.     Right upper arm: No swelling.     Right lower leg: No edema.     Left lower leg: No edema.  Lymphadenopathy:     Cervical: No cervical adenopathy.     Upper Body:     Right upper body: No supraclavicular or axillary adenopathy.     Left upper body: No supraclavicular or axillary adenopathy.     Lower Body: No right inguinal adenopathy. No left inguinal adenopathy.  Skin:    General: Skin is warm.     Coloration: Skin is not jaundiced.     Findings: No lesion or rash.  Neurological:     General: No focal deficit present.     Mental Status: He is alert and oriented to person, place, and time. Mental status is at baseline.  Psychiatric:        Mood and Affect: Mood normal.        Behavior: Behavior normal.        Thought Content: Thought content normal.   SCANS:  His PET scan done yesterday revealed the following:   ASSESSMENT & PLAN:  Assessment/Plan:  A 58 y.o. male with stage IIIB (T4 N2  M0) squamous cell lung cancer, who is over 5 years out from all of his definitive therapy.  He completed 1 full year of maintenance durvalumab immunotherapy, which was preceded by chemoradiation.  In clinic today, I went over his PET scan images with him, for which there appear to be faint areas of hypermetabolic activity in his right hilar region.  Although present, this gentleman has had multiple scans over the past few years which have not shown any local disease progression.  Although I am not convinced he has persistent disease in his right hilar region, I will repeat a PET scan in 6 months to see if hypermetabolic activity is still present.  If so, a bronchoscopy to evaluate this area may need to be considered.  The patient understands all the plans discussed today and is in agreement with  them.    Laqueisha Catalina DELENA Kerns, MD

## 2024-03-20 ENCOUNTER — Inpatient Hospital Stay (HOSPITAL_BASED_OUTPATIENT_CLINIC_OR_DEPARTMENT_OTHER): Admitting: Oncology

## 2024-03-20 ENCOUNTER — Telehealth: Payer: Self-pay | Admitting: Oncology

## 2024-03-20 ENCOUNTER — Inpatient Hospital Stay: Attending: Oncology

## 2024-03-20 VITALS — BP 147/87 | HR 84 | Temp 97.8°F | Resp 16 | Ht 72.0 in | Wt 229.0 lb

## 2024-03-20 DIAGNOSIS — Z08 Encounter for follow-up examination after completed treatment for malignant neoplasm: Secondary | ICD-10-CM | POA: Diagnosis present

## 2024-03-20 DIAGNOSIS — F17200 Nicotine dependence, unspecified, uncomplicated: Secondary | ICD-10-CM | POA: Diagnosis not present

## 2024-03-20 DIAGNOSIS — C3401 Malignant neoplasm of right main bronchus: Secondary | ICD-10-CM

## 2024-03-20 DIAGNOSIS — Z923 Personal history of irradiation: Secondary | ICD-10-CM | POA: Insufficient documentation

## 2024-03-20 DIAGNOSIS — R918 Other nonspecific abnormal finding of lung field: Secondary | ICD-10-CM | POA: Insufficient documentation

## 2024-03-20 DIAGNOSIS — Z9221 Personal history of antineoplastic chemotherapy: Secondary | ICD-10-CM | POA: Insufficient documentation

## 2024-03-20 DIAGNOSIS — Z85118 Personal history of other malignant neoplasm of bronchus and lung: Secondary | ICD-10-CM | POA: Diagnosis not present

## 2024-03-20 LAB — CMP (CANCER CENTER ONLY)
ALT: 31 U/L (ref 0–44)
AST: 31 U/L (ref 15–41)
Albumin: 4.3 g/dL (ref 3.5–5.0)
Alkaline Phosphatase: 147 U/L — ABNORMAL HIGH (ref 38–126)
Anion gap: 11 (ref 5–15)
BUN: 12 mg/dL (ref 6–20)
CO2: 24 mmol/L (ref 22–32)
Calcium: 9.1 mg/dL (ref 8.9–10.3)
Chloride: 102 mmol/L (ref 98–111)
Creatinine: 1.4 mg/dL — ABNORMAL HIGH (ref 0.61–1.24)
GFR, Estimated: 58 mL/min — ABNORMAL LOW (ref >60)
Glucose, Bld: 116 mg/dL — ABNORMAL HIGH (ref 70–99)
Potassium: 4.5 mmol/L (ref 3.5–5.1)
Sodium: 137 mmol/L (ref 135–145)
Total Bilirubin: 0.7 mg/dL (ref 0.0–1.2)
Total Protein: 7.2 g/dL (ref 6.5–8.1)

## 2024-03-20 LAB — CBC WITH DIFFERENTIAL (CANCER CENTER ONLY)
Abs Immature Granulocytes: 0.01 K/uL (ref 0.00–0.07)
Basophils Absolute: 0 K/uL (ref 0.0–0.1)
Basophils Relative: 1 %
Eosinophils Absolute: 0.1 K/uL (ref 0.0–0.5)
Eosinophils Relative: 1 %
HCT: 43.7 % (ref 39.0–52.0)
Hemoglobin: 15.5 g/dL (ref 13.0–17.0)
Immature Granulocytes: 0 %
Lymphocytes Relative: 20 %
Lymphs Abs: 1.2 K/uL (ref 0.7–4.0)
MCH: 33.5 pg (ref 26.0–34.0)
MCHC: 35.5 g/dL (ref 30.0–36.0)
MCV: 94.6 fL (ref 80.0–100.0)
Monocytes Absolute: 0.5 K/uL (ref 0.1–1.0)
Monocytes Relative: 9 %
Neutro Abs: 4.2 K/uL (ref 1.7–7.7)
Neutrophils Relative %: 69 %
Platelet Count: 239 K/uL (ref 150–400)
RBC: 4.62 MIL/uL (ref 4.22–5.81)
RDW: 13 % (ref 11.5–15.5)
WBC Count: 6 K/uL (ref 4.0–10.5)
nRBC: 0 % (ref 0.0–0.2)

## 2024-03-20 NOTE — Telephone Encounter (Signed)
 Patient has been scheduled for follow-up visit per 03/18/24 LOS.  Pt given an appt calendar with date and time.

## 2024-03-21 ENCOUNTER — Telehealth: Payer: Self-pay

## 2024-03-21 NOTE — Telephone Encounter (Signed)
 Pt states that,Dr Ezzard was waiting for some report and was to get back with me. I haven't heard anything yet, so I wanted to check in.

## 2024-03-23 ENCOUNTER — Other Ambulatory Visit: Payer: Self-pay | Admitting: Oncology

## 2024-03-23 ENCOUNTER — Encounter: Payer: Self-pay | Admitting: Oncology

## 2024-03-23 DIAGNOSIS — C3401 Malignant neoplasm of right main bronchus: Secondary | ICD-10-CM

## 2024-03-26 ENCOUNTER — Encounter: Payer: Self-pay | Admitting: Oncology

## 2024-04-01 ENCOUNTER — Other Ambulatory Visit

## 2024-04-01 ENCOUNTER — Ambulatory Visit: Admitting: Oncology

## 2024-04-11 ENCOUNTER — Encounter: Payer: Self-pay | Admitting: Oncology

## 2024-04-16 ENCOUNTER — Other Ambulatory Visit: Payer: Self-pay

## 2024-04-16 DIAGNOSIS — C3401 Malignant neoplasm of right main bronchus: Secondary | ICD-10-CM

## 2024-04-16 DIAGNOSIS — C3411 Malignant neoplasm of upper lobe, right bronchus or lung: Secondary | ICD-10-CM

## 2024-04-16 NOTE — Addendum Note (Signed)
 Addended by: PERLEY ALAN SAILOR on: 04/16/2024 03:24 PM   Modules accepted: Orders

## 2024-05-20 ENCOUNTER — Ambulatory Visit: Admitting: Oncology

## 2024-05-20 ENCOUNTER — Other Ambulatory Visit

## 2024-06-09 ENCOUNTER — Other Ambulatory Visit (HOSPITAL_BASED_OUTPATIENT_CLINIC_OR_DEPARTMENT_OTHER): Admitting: Radiology

## 2024-06-09 ENCOUNTER — Ambulatory Visit (HOSPITAL_BASED_OUTPATIENT_CLINIC_OR_DEPARTMENT_OTHER)
Admission: RE | Admit: 2024-06-09 | Discharge: 2024-06-09 | Disposition: A | Discharge status: Home / Self Care | Source: Ambulatory Visit | Attending: Oncology | Admitting: Oncology

## 2024-06-09 DIAGNOSIS — C3401 Malignant neoplasm of right main bronchus: Secondary | ICD-10-CM | POA: Diagnosis not present

## 2024-06-09 DIAGNOSIS — C3411 Malignant neoplasm of upper lobe, right bronchus or lung: Secondary | ICD-10-CM | POA: Diagnosis not present

## 2024-06-09 MED ORDER — FLUDEOXYGLUCOSE F - 18 (FDG) INJECTION
13.8100 | Freq: Once | INTRAVENOUS | Status: AC | PRN
  Administered 2024-06-09: 12.62 via INTRAVENOUS

## 2024-06-09 NOTE — Progress Notes (Deleted)
 [facility] [address] [place],  [place]  [address] [phone]  Clinic Day:  03/20/2024  Referring physician: Hunter Mickey Browner, PA-C   HISTORY OF PRESENT ILLNESS:  The patient is a 58 y.o. 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  His definitive therapy consisted of concurrent chemoradiation, which included weekly carboplatin/paclitaxel.  This was followed by a full year of maintenance durvalumab immunotherapy, which was completed in September 2020.  He comes in today to go over his PET scan done yesterday to ensure there are no signs of disease recurrence.  Since his last visit, the patient has been doing fairly well.  Since he had stroke-like symptoms earlier this year, he has no longer smoked. With respect to his lung cancer, he denies having any new respiratory symptoms which concern him for overt signs of disease progression.  He still smokes on a minimal basis.    PHYSICAL EXAM:  There were no vitals taken for this visit. Wt Readings from Last 3 Encounters:  03/20/24 229 lb (103.9 kg)  09/21/23 239 lb 4.8 oz (108.5 kg)  06/05/23 228 lb 6.4 oz (103.6 kg)   There is no height or weight on file to calculate BMI. Performance status (ECOG): 1 - Symptomatic but completely ambulatory Physical Exam Constitutional:      Appearance: Normal appearance. He is not ill-appearing.  HENT:     Mouth/Throat:     Mouth: Mucous membranes are moist.     Pharynx: Oropharynx is clear. No oropharyngeal exudate or posterior oropharyngeal erythema.  Cardiovascular:     Rate and Rhythm: Normal rate and regular rhythm.     Heart sounds: No murmur heard.    No friction rub. No gallop.  Pulmonary:     Effort: Pulmonary effort is normal. No respiratory distress.     Breath sounds: Normal breath sounds. No wheezing, rhonchi or rales.  Abdominal:     General: Bowel sounds are normal. There is no distension.     Palpations: Abdomen is soft. There is  no mass.     Tenderness: There is no abdominal tenderness.  Musculoskeletal:        General: No swelling.     Right upper arm: No swelling.     Right lower leg: No edema.     Left lower leg: No edema.  Lymphadenopathy:     Cervical: No cervical adenopathy.     Upper Body:     Right upper body: No supraclavicular or axillary adenopathy.     Left upper body: No supraclavicular or axillary adenopathy.     Lower Body: No right inguinal adenopathy. No left inguinal adenopathy.  Skin:    General: Skin is warm.     Coloration: Skin is not jaundiced.     Findings: No lesion or rash.  Neurological:     General: No focal deficit present.     Mental Status: He is alert and oriented to person, place, and time. Mental status is at baseline.  Psychiatric:        Mood and Affect: Mood normal.        Behavior: Behavior normal.        Thought Content: Thought content normal.   SCANS:  His PET scan done on 06-09-24 revealed the following: FINDINGS:   HEAD AND NECK: No hypermetabolic cervical lymph nodes are identified. Chronic right sided mastoid opacification.   CHEST: Medial right upper lobe presumably radiation induced consolidation at 4.8  x 4.1 cm, similar to the 03/19/2024 PET. This demonstrates similar heterogeneous hypermetabolism including it up to SUV 7.7 today versus SUV 7.2 on the prior. Relatively similar in CT appearance back to at least 11/16/2022. Relatively symmetric bilateral hilar hypermetabolism. Example on the right at SUV 7.8 today versus SUV 8.1 on the prior. No well defined adenopathy in these regions. Subcarinal node measures 8 mm and SUV 6.4 today versus similar in size and SUV 7.4 in the prior. No hypermetabolic axillary lymph nodes.   ABDOMEN AND PELVIS: There is no hypermetabolic activity within the liver, adrenal glands, spleen or pancreas. Normal adrenal glands. There is no hypermetabolic nodal activity in the abdomen or pelvis. Physiologic activity within the  gastrointestinal and genitourinary systems. Mild prostatomegaly.   BONES AND SOFT TISSUE: There is no hypermetabolic activity to suggest osseous metastatic disease. Hypermetabolism about the right rotator cuff at SUV 5.2 is likely due to tendinous strain. Right hip arthroplasty. Presumed bone island within the right glenoid, similar back to 11/16/2022. Left shoulder arthroplasty.   VASCULATURE: Bilateral carotid atherosclerosis. Abdominal aortic atherosclerosis.   HEART AND MEDIASTINUM: Aortic and coronary artery calcification.   LUNGS: Centrilobular emphysema. Tiny bilateral pulmonary nodules which are below PET resolution and felt to be similar. Example along the right minor fissure at 3-4 mm on image 33/4.   IMPRESSION: 1. Since the [facility] PET of 03/19/24, no significant change. 2. Right upper lobe presumably radiation induced consolidation with heterogeneous hypermetabolism. Given ongoing stability, favored to all be treatment related. Residual disease cannot be excluded, given residual activity. 3. Symmetric mediastinal and bilateral hilar nodal hypermetabolism is unchanged. Given symmetric distribution and lack of well defined adenopathy, favor chronic infectious or inflammatory etiology. Recommend ongoing CT surveillance. 4. No hypermetabolic extrathoracic metastatic disease. 5. Age advanced coronary artery atherosclerosis. Consider correlation with risk factors and medical therapy. 6. Aortic atherosclerosis (ICD10-I70.0) and emphysema (ICD10-J43.9).  ASSESSMENT & PLAN:  Assessment/Plan:  A 58 y.o. 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  He completed 1 full year of maintenance durvalumab immunotherapy, which was preceded by chemoradiation.  In clinic today, I went over his PET scan images with him, for which there appear to be faint areas of hypermetabolic activity in his right hilar  region.  Although present, this gentleman has had multiple scans over the past few years which have not shown any local disease progression.  Although I am not convinced he has persistent disease in his right hilar region, I will repeat a PET scan in 6 months to see if hypermetabolic activity is still present.  If so, a bronchoscopy to evaluate this area may need to be considered.  The patient understands all the plans discussed today and is in agreement with them.    Lain Tetterton DELENA Kerns, MD

## 2024-06-10 ENCOUNTER — Inpatient Hospital Stay: Admitting: Oncology

## 2024-06-10 ENCOUNTER — Inpatient Hospital Stay

## 2024-06-17 ENCOUNTER — Inpatient Hospital Stay: Admitting: Oncology

## 2024-06-17 ENCOUNTER — Inpatient Hospital Stay

## 2024-06-17 NOTE — Progress Notes (Unsigned)
 [facility] [address] [place],  [place]  [address] [phone]  Clinic Day:  03/20/2024  Referring physician: Hunter Mickey Browner, PA-C   HISTORY OF PRESENT ILLNESS:  The patient is a 58 y.o. 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  His definitive therapy consisted of concurrent chemoradiation, which included weekly carboplatin/paclitaxel.  This was followed by a full year of maintenance durvalumab immunotherapy, which was completed in September 2020.  He comes in today to go over his PET scan done yesterday to ensure there are no signs of disease recurrence.  Since his last visit, the patient has been doing fairly well.  Since he had stroke-like symptoms earlier this year, he has no longer smoked. With respect to his lung cancer, he denies having any new respiratory symptoms which concern him for overt signs of disease progression.  He still smokes on a minimal basis.    PHYSICAL EXAM:  There were no vitals taken for this visit. Wt Readings from Last 3 Encounters:  03/20/24 229 lb (103.9 kg)  09/21/23 239 lb 4.8 oz (108.5 kg)  06/05/23 228 lb 6.4 oz (103.6 kg)   There is no height or weight on file to calculate BMI. Performance status (ECOG): 1 - Symptomatic but completely ambulatory Physical Exam Constitutional:      Appearance: Normal appearance. He is not ill-appearing.  HENT:     Mouth/Throat:     Mouth: Mucous membranes are moist.     Pharynx: Oropharynx is clear. No oropharyngeal exudate or posterior oropharyngeal erythema.  Cardiovascular:     Rate and Rhythm: Normal rate and regular rhythm.     Heart sounds: No murmur heard.    No friction rub. No gallop.  Pulmonary:     Effort: Pulmonary effort is normal. No respiratory distress.     Breath sounds: Normal breath sounds. No wheezing, rhonchi or rales.  Abdominal:     General: Bowel sounds are normal. There is no distension.     Palpations: Abdomen is soft. There is  no mass.     Tenderness: There is no abdominal tenderness.  Musculoskeletal:        General: No swelling.     Right upper arm: No swelling.     Right lower leg: No edema.     Left lower leg: No edema.  Lymphadenopathy:     Cervical: No cervical adenopathy.     Upper Body:     Right upper body: No supraclavicular or axillary adenopathy.     Left upper body: No supraclavicular or axillary adenopathy.     Lower Body: No right inguinal adenopathy. No left inguinal adenopathy.  Skin:    General: Skin is warm.     Coloration: Skin is not jaundiced.     Findings: No lesion or rash.  Neurological:     General: No focal deficit present.     Mental Status: He is alert and oriented to person, place, and time. Mental status is at baseline.  Psychiatric:        Mood and Affect: Mood normal.        Behavior: Behavior normal.        Thought Content: Thought content normal.   SCANS:  His PET scan done on 06-09-24 revealed the following: FINDINGS:   HEAD AND NECK: No hypermetabolic cervical lymph nodes are identified. Chronic right sided mastoid opacification.   CHEST: Medial right upper lobe presumably radiation induced consolidation at 4.8  x 4.1 cm, similar to the 03/19/2024 PET. This demonstrates similar heterogeneous hypermetabolism including it up to SUV 7.7 today versus SUV 7.2 on the prior. Relatively similar in CT appearance back to at least 11/16/2022. Relatively symmetric bilateral hilar hypermetabolism. Example on the right at SUV 7.8 today versus SUV 8.1 on the prior. No well defined adenopathy in these regions. Subcarinal node measures 8 mm and SUV 6.4 today versus similar in size and SUV 7.4 in the prior. No hypermetabolic axillary lymph nodes.   ABDOMEN AND PELVIS: There is no hypermetabolic activity within the liver, adrenal glands, spleen or pancreas. Normal adrenal glands. There is no hypermetabolic nodal activity in the abdomen or pelvis. Physiologic activity within the  gastrointestinal and genitourinary systems. Mild prostatomegaly.   BONES AND SOFT TISSUE: There is no hypermetabolic activity to suggest osseous metastatic disease. Hypermetabolism about the right rotator cuff at SUV 5.2 is likely due to tendinous strain. Right hip arthroplasty. Presumed bone island within the right glenoid, similar back to 11/16/2022. Left shoulder arthroplasty.   VASCULATURE: Bilateral carotid atherosclerosis. Abdominal aortic atherosclerosis.   HEART AND MEDIASTINUM: Aortic and coronary artery calcification.   LUNGS: Centrilobular emphysema. Tiny bilateral pulmonary nodules which are below PET resolution and felt to be similar. Example along the right minor fissure at 3-4 mm on image 33/4.   IMPRESSION: 1. Since the [facility] PET of 03/19/24, no significant change. 2. Right upper lobe presumably radiation induced consolidation with heterogeneous hypermetabolism. Given ongoing stability, favored to all be treatment related. Residual disease cannot be excluded, given residual activity. 3. Symmetric mediastinal and bilateral hilar nodal hypermetabolism is unchanged. Given symmetric distribution and lack of well defined adenopathy, favor chronic infectious or inflammatory etiology. Recommend ongoing CT surveillance. 4. No hypermetabolic extrathoracic metastatic disease. 5. Age advanced coronary artery atherosclerosis. Consider correlation with risk factors and medical therapy. 6. Aortic atherosclerosis (ICD10-I70.0) and emphysema (ICD10-J43.9).  ASSESSMENT & PLAN:  Assessment/Plan:  A 58 y.o. 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  He completed 1 full year of maintenance durvalumab immunotherapy, which was preceded by chemoradiation.  In clinic today, I went over his PET scan images with him, for which there appear to be faint areas of hypermetabolic activity in his right hilar  region.  Although present, this gentleman has had multiple scans over the past few years which have not shown any local disease progression.  Although I am not convinced he has persistent disease in his right hilar region, I will repeat a PET scan in 6 months to see if hypermetabolic activity is still present.  If so, a bronchoscopy to evaluate this area may need to be considered.  The patient understands all the plans discussed today and is in agreement with them.    Jeanie Mccard DELENA Kerns, MD

## 2024-06-18 ENCOUNTER — Inpatient Hospital Stay: Admitting: Oncology

## 2024-06-18 ENCOUNTER — Inpatient Hospital Stay: Attending: Oncology

## 2024-06-18 ENCOUNTER — Telehealth: Payer: Self-pay | Admitting: Oncology

## 2024-06-18 VITALS — BP 146/70 | HR 54 | Temp 98.1°F | Resp 16 | Ht 72.0 in | Wt 228.5 lb

## 2024-06-18 DIAGNOSIS — R042 Hemoptysis: Secondary | ICD-10-CM

## 2024-06-18 DIAGNOSIS — Z923 Personal history of irradiation: Secondary | ICD-10-CM | POA: Insufficient documentation

## 2024-06-18 DIAGNOSIS — Z9221 Personal history of antineoplastic chemotherapy: Secondary | ICD-10-CM | POA: Insufficient documentation

## 2024-06-18 DIAGNOSIS — Z85118 Personal history of other malignant neoplasm of bronchus and lung: Secondary | ICD-10-CM | POA: Diagnosis present

## 2024-06-18 DIAGNOSIS — C3401 Malignant neoplasm of right main bronchus: Secondary | ICD-10-CM

## 2024-06-18 LAB — CBC WITH DIFFERENTIAL (CANCER CENTER ONLY)
Abs Immature Granulocytes: 0.02 K/uL (ref 0.00–0.07)
Basophils Absolute: 0 K/uL (ref 0.0–0.1)
Basophils Relative: 0 %
Eosinophils Absolute: 0 K/uL (ref 0.0–0.5)
Eosinophils Relative: 1 %
HCT: 46 % (ref 39.0–52.0)
Hemoglobin: 16 g/dL (ref 13.0–17.0)
Immature Granulocytes: 0 %
Lymphocytes Relative: 14 %
Lymphs Abs: 0.9 K/uL (ref 0.7–4.0)
MCH: 32.9 pg (ref 26.0–34.0)
MCHC: 34.8 g/dL (ref 30.0–36.0)
MCV: 94.5 fL (ref 80.0–100.0)
Monocytes Absolute: 0.4 K/uL (ref 0.1–1.0)
Monocytes Relative: 7 %
Neutro Abs: 5.2 K/uL (ref 1.7–7.7)
Neutrophils Relative %: 78 %
Platelet Count: 247 K/uL (ref 150–400)
RBC: 4.87 MIL/uL (ref 4.22–5.81)
RDW: 12.9 % (ref 11.5–15.5)
WBC Count: 6.6 K/uL (ref 4.0–10.5)
nRBC: 0 % (ref 0.0–0.2)

## 2024-06-18 LAB — CMP (CANCER CENTER ONLY)
ALT: 22 U/L (ref 0–44)
AST: 23 U/L (ref 15–41)
Albumin: 4.1 g/dL (ref 3.5–5.0)
Alkaline Phosphatase: 125 U/L (ref 38–126)
Anion gap: 8 (ref 5–15)
BUN: 14 mg/dL (ref 6–20)
CO2: 26 mmol/L (ref 22–32)
Calcium: 9.2 mg/dL (ref 8.9–10.3)
Chloride: 102 mmol/L (ref 98–111)
Creatinine: 1.23 mg/dL (ref 0.61–1.24)
GFR, Estimated: >60 mL/min (ref >60)
Glucose, Bld: 118 mg/dL — ABNORMAL HIGH (ref 70–99)
Potassium: 4.4 mmol/L (ref 3.5–5.1)
Sodium: 137 mmol/L (ref 135–145)
Total Bilirubin: 0.8 mg/dL (ref 0.0–1.2)
Total Protein: 7.2 g/dL (ref 6.5–8.1)

## 2024-06-18 NOTE — Telephone Encounter (Signed)
 Patient has been scheduled for follow-up visit per 06/18/2024 LOS.  Pt given an appt calendar with date and time.

## 2024-06-23 ENCOUNTER — Other Ambulatory Visit (HOSPITAL_BASED_OUTPATIENT_CLINIC_OR_DEPARTMENT_OTHER): Admitting: Radiology

## 2024-06-24 ENCOUNTER — Other Ambulatory Visit

## 2024-06-24 ENCOUNTER — Ambulatory Visit: Admitting: Oncology

## 2024-09-17 ENCOUNTER — Ambulatory Visit: Admitting: Oncology

## 2024-09-17 ENCOUNTER — Other Ambulatory Visit
